# Patient Record
Sex: Female | Born: 1940
Health system: Southern US, Community
[De-identification: ages and names within clinical notes are randomized; demographics above are authoritative.]

## PROBLEM LIST (undated history)

## (undated) DIAGNOSIS — C801 Malignant (primary) neoplasm, unspecified: Secondary | ICD-10-CM

## (undated) DIAGNOSIS — I10 Essential (primary) hypertension: Secondary | ICD-10-CM

## (undated) DIAGNOSIS — J309 Allergic rhinitis, unspecified: Secondary | ICD-10-CM

## (undated) DIAGNOSIS — K635 Polyp of colon: Secondary | ICD-10-CM

## (undated) DIAGNOSIS — N189 Chronic kidney disease, unspecified: Secondary | ICD-10-CM

## (undated) HISTORY — PX: TONSILLECTOMY: SUR1361

---

## 1999-05-25 ENCOUNTER — Encounter: Admission: RE | Admit: 1999-05-25 | Discharge: 1999-05-25 | Payer: Self-pay | Admitting: Internal Medicine

## 1999-05-25 ENCOUNTER — Encounter: Payer: Self-pay | Admitting: Internal Medicine

## 1999-05-25 ENCOUNTER — Other Ambulatory Visit: Admission: RE | Admit: 1999-05-25 | Discharge: 1999-05-25 | Payer: Self-pay | Admitting: Internal Medicine

## 2000-05-24 ENCOUNTER — Encounter: Admission: RE | Admit: 2000-05-24 | Discharge: 2000-05-24 | Payer: Self-pay | Admitting: Internal Medicine

## 2000-05-24 ENCOUNTER — Encounter: Payer: Self-pay | Admitting: Internal Medicine

## 2001-07-10 ENCOUNTER — Other Ambulatory Visit: Admission: RE | Admit: 2001-07-10 | Discharge: 2001-07-10 | Payer: Self-pay | Admitting: Internal Medicine

## 2001-07-10 ENCOUNTER — Encounter: Admission: RE | Admit: 2001-07-10 | Discharge: 2001-07-10 | Payer: Self-pay | Admitting: Internal Medicine

## 2001-07-10 ENCOUNTER — Encounter: Payer: Self-pay | Admitting: Internal Medicine

## 2005-08-26 ENCOUNTER — Ambulatory Visit: Payer: Self-pay | Admitting: Internal Medicine

## 2005-09-14 ENCOUNTER — Ambulatory Visit: Payer: Self-pay | Admitting: Internal Medicine

## 2006-09-28 ENCOUNTER — Ambulatory Visit: Payer: Self-pay | Admitting: Internal Medicine

## 2007-09-29 ENCOUNTER — Ambulatory Visit: Payer: Self-pay | Admitting: Internal Medicine

## 2008-08-21 ENCOUNTER — Ambulatory Visit: Payer: Self-pay | Admitting: Unknown Physician Specialty

## 2008-08-21 ENCOUNTER — Encounter (INDEPENDENT_AMBULATORY_CARE_PROVIDER_SITE_OTHER): Payer: Self-pay | Admitting: *Deleted

## 2008-10-31 ENCOUNTER — Ambulatory Visit: Payer: Self-pay | Admitting: Internal Medicine

## 2008-11-14 ENCOUNTER — Ambulatory Visit: Payer: Self-pay | Admitting: Internal Medicine

## 2009-06-12 ENCOUNTER — Telehealth: Payer: Self-pay | Admitting: Gastroenterology

## 2009-11-13 ENCOUNTER — Ambulatory Visit: Payer: Self-pay | Admitting: Internal Medicine

## 2010-05-12 NOTE — Progress Notes (Signed)
Summary: Schedule Colonoscopy  Phone Note Outgoing Call Call back at [facility] Phone [phone]   Call placed by: Harlow Mares CMA Duncan Dull),  June 12, 2009 1:18 PM Call placed to: Patient Summary of Call: No Answer patient is due for a colonoscopy Initial call taken by: Harlow Mares CMA Duncan Dull),  June 12, 2009 1:18 PM  Follow-up for Phone Call        No Answer Follow-up by: Harlow Mares CMA Duncan Dull),  June 19, 2009 3:52 PM

## 2010-12-23 ENCOUNTER — Ambulatory Visit: Payer: Self-pay | Admitting: Internal Medicine

## 2011-10-26 ENCOUNTER — Encounter: Payer: Self-pay | Admitting: Gastroenterology

## 2011-12-24 ENCOUNTER — Ambulatory Visit: Payer: Self-pay | Admitting: Internal Medicine

## 2012-12-25 ENCOUNTER — Ambulatory Visit: Payer: Self-pay | Admitting: Internal Medicine

## 2013-03-15 ENCOUNTER — Ambulatory Visit (INDEPENDENT_AMBULATORY_CARE_PROVIDER_SITE_OTHER): Payer: Medicare Other | Admitting: Podiatrist

## 2013-03-15 ENCOUNTER — Encounter: Payer: Self-pay | Admitting: Podiatrist

## 2013-03-15 VITALS — BP 148/75 | HR 52 | Resp 16 | Ht 65.0 in | Wt 141.5 lb

## 2013-03-15 DIAGNOSIS — L600 Ingrowing nail: Secondary | ICD-10-CM

## 2013-03-15 NOTE — Patient Instructions (Signed)
ANTIBACTERIAL SOAP INSTRUCTIONS  THE DAY AFTER PROCEDURE-- you may use topical antibiotic ointment you may already have at home (polysporin, mupirocin, neosporin ointment)      Shower as usual. Before getting out, place a drop of antibacterial liquid soap (Dial) on a wet, clean washcloth.  Gently wipe washcloth over affected area.  Afterward, rinse the area with warm water.  Blot the area dry with a soft cloth and cover with antibiotic ointment (neosporin, polysporin, bacitracin) and band aid or gauze and tape  OR   Place 3-4 drops of antibacterial liquid soap in a quart of warm tap water.  Submerge foot into water for 20 minutes.  If bandage was applied after your procedure, leave on to allow for easy lift off, then remove and continue with soak for the remaining time.  Next, blot area dry with a soft cloth and cover with a bandage.  Apply other medications as directed by your doctor, such as cortisporin otic solution (eardrops) or neosporin antibiotic ointment    For the rough ridges on your toes, try a manicure/pedicure rotary burr kit.. This will quickly help get rid of dry skin and ridges on your feet.

## 2013-03-15 NOTE — Progress Notes (Signed)
MRN: [id number] Name: GLENDOLA FRIEDHOFF  Sex: female Age: 72 y.o. DOB: 1940-11-06  Provider: Marlowe Aschoff P  Allergies: Review of patient's allergies indicates no known allergies.   Chief Complaint  Patient presents with  . Follow-up    CHECK MY TOENAILS AND THE BOTTOM OF MY FEET     HPI: Patient is 72 y.o. female who presents today for a painful ingrowing toenail on the left hallux nail along the medial border.  Patient walks a lot and states it is uncomfortable when she walks.    No past medical history on file.     Medication List       This list is accurate as of: 03/15/13 11:59 PM.  Always use your most recent med list.               B-12 PO  Take by mouth daily.     CALCIUM + D PO  Take by mouth daily.     CINNAMON PO  Take by mouth daily.     CRANBERRY PO  Take by mouth daily.     VITAMIN C PO  Take by mouth daily.         No past surgical history on file.     Review of Systems  DATA OBTAINED: from patient intake form GENERAL: Feels well no fevers, no fatigue, no changes in appetite SKIN: No itching, no rashes, no open lesions, no wounds EYES: No eye pain,no redness, no discharge EARS: No earache,no ringing of ears, no recent change in hearing NOSE: No congestion, no drainage, no bleeding  MOUTH/THROAT: No mouth pain, No sore throat, No difficulty chewing or swallowing  RESPIRATORY: No cough, no wheezing, no SOB CARDIAC: No chest pain,no heart palpitations,no new onset lower extremity edema  GI: No abdominal pain, No Nausea, no vomiting, no diarrhea, no heartburn or no reflux  GU: No dysuria, no increased frequency or urgency MUSCULOSKELETAL: No unrelieved bone/joint pain,  NEUROLOGIC: Awake, alert, appropriate to situation, No change in mental status. PSYCHIATRIC: No overt anxiety or sadness.No behavior issue.  AMBULATION:  Ambulates unassisted  Filed Vitals:   03/15/13 0951  BP: 148/75  Pulse: 52  Resp: 16    Physical Exam  GENERAL  APPEARANCE: Alert, conversant. Appropriately groomed. No acute distress.  VASCULAR: Pedal pulses palpable and strong bilateral.  Capillary refill time is immediate to all digits,  Proximal to distal cooling it warm to warm.  Digital hair growth is present bilateral  NEUROLOGIC: sensation is intact epicritically and protectively to 5.07 monofilament at 5/5 sites bilateral.  Light touch is intact bilateral, vibratory sensation intact bilateral, achilles tendon reflex is intact bilateral.  MUSCULOSKELETAL: acceptable muscle strength, tone and stability bilateral.  Intrinsic muscluature intact bilateral.  Rectus appearance of foot and digits noted bilateral.   DERMATOLOGIC: skin color, texture, and turger are within normal limits.  No preulcerative lesions are seen, no interdigital maceration noted.  No open lesions present.  Left hallux nail medial nail border is ingrown and painful.  No signs of infection present.  Small area of hpk present along the hallux and first interspace bilateral.    Assessment  Ingrown toenail left hallux, medial border  Plan:  Treatment options and alternatives discussed.  Recommended permanent phenol matrixectomy and patient agreed.  Left hallux was prepped with alcohol and a 1 to 1 mix of 0.5% marcaine plain and 2% lidocaine plain was administered in a digital block fashion.  The toe was then prepped with betadine solution and  exsanguinated.  The offending nail border was then excised and matrix tissue exposed.  Phenol was then applied to the matrix tissue followed by an alcohol wash.  Antibiotic ointment and a dry sterile dressing was applied.  The patient was dispensed instructions for aftercare.      Delories Heinz, DPM

## 2013-03-22 ENCOUNTER — Encounter: Payer: Self-pay | Admitting: Podiatrist

## 2013-03-22 ENCOUNTER — Ambulatory Visit (INDEPENDENT_AMBULATORY_CARE_PROVIDER_SITE_OTHER): Payer: Medicare Other | Admitting: Podiatrist

## 2013-03-22 VITALS — BP 139/84 | HR 60 | Resp 16

## 2013-03-22 DIAGNOSIS — L600 Ingrowing nail: Secondary | ICD-10-CM

## 2013-03-22 NOTE — Progress Notes (Signed)
Melissa Harvey presents today for followup of ingrown toenail procedure medial aspect left great toe where permanent phenol matrixectomy was performed. She states "it still hurts" she states she's been trying to walk and do her circuit training however it became painful and uncomfortable after activity. She has been cleansing and daily and applying a bandage most of time.  Objective: Excellent appearance of the toe is seen. No swelling, no redness, no streaking, no drainage is noted.  Assessment: Status post permanent phenol matrixectomy medial aspect left great toe  Plan: Discussed the toe looks excellent. I'm not concerned about infection. Recommended the use of Epsom salts soaks and hydrogen peroxide to dissolve the line of scab in the corner. She's instructed to keep the toe covered during the day and leave open to air at night. She will be seen back as needed.  Marlowe Aschoff DPM

## 2013-03-22 NOTE — Progress Notes (Deleted)
   Subjective:    Patient ID: Melissa Harvey, female    DOB: [birth year], 72 y.o.   MRN: [id number]  HPI Comments: " It hurts still, left great toe.     Review of Systems     Objective:   Physical Exam  Nail uncomfortable but looks great.. Medial aspect left first      Assessment & Plan:

## 2013-10-05 ENCOUNTER — Ambulatory Visit: Payer: Self-pay | Admitting: Unknown Physician Specialty

## 2013-10-08 LAB — PATHOLOGY REPORT

## 2013-12-27 ENCOUNTER — Ambulatory Visit: Payer: Self-pay | Admitting: Internal Medicine

## 2014-02-10 ENCOUNTER — Emergency Department: Payer: Self-pay | Admitting: Emergency Medicine

## 2015-01-03 ENCOUNTER — Other Ambulatory Visit: Payer: Self-pay | Admitting: Internal Medicine

## 2015-01-03 DIAGNOSIS — Z1231 Encounter for screening mammogram for malignant neoplasm of breast: Secondary | ICD-10-CM

## 2015-01-10 ENCOUNTER — Ambulatory Visit
Admission: RE | Admit: 2015-01-10 | Discharge: 2015-01-10 | Disposition: A | Discharge status: Home / Self Care | Payer: PPO | Source: Ambulatory Visit | Attending: Internal Medicine | Admitting: Internal Medicine

## 2015-01-10 DIAGNOSIS — Z1231 Encounter for screening mammogram for malignant neoplasm of breast: Secondary | ICD-10-CM | POA: Diagnosis present

## 2015-05-05 DIAGNOSIS — M9905 Segmental and somatic dysfunction of pelvic region: Secondary | ICD-10-CM | POA: Diagnosis not present

## 2015-05-05 DIAGNOSIS — M5416 Radiculopathy, lumbar region: Secondary | ICD-10-CM | POA: Diagnosis not present

## 2015-05-05 DIAGNOSIS — M62830 Muscle spasm of back: Secondary | ICD-10-CM | POA: Diagnosis not present

## 2015-05-05 DIAGNOSIS — M9903 Segmental and somatic dysfunction of lumbar region: Secondary | ICD-10-CM | POA: Diagnosis not present

## 2015-05-15 DIAGNOSIS — L821 Other seborrheic keratosis: Secondary | ICD-10-CM | POA: Diagnosis not present

## 2015-05-15 DIAGNOSIS — X32XXXA Exposure to sunlight, initial encounter: Secondary | ICD-10-CM | POA: Diagnosis not present

## 2015-05-15 DIAGNOSIS — L570 Actinic keratosis: Secondary | ICD-10-CM | POA: Diagnosis not present

## 2015-05-15 DIAGNOSIS — L720 Epidermal cyst: Secondary | ICD-10-CM | POA: Diagnosis not present

## 2015-06-02 DIAGNOSIS — M5416 Radiculopathy, lumbar region: Secondary | ICD-10-CM | POA: Diagnosis not present

## 2015-06-02 DIAGNOSIS — M9905 Segmental and somatic dysfunction of pelvic region: Secondary | ICD-10-CM | POA: Diagnosis not present

## 2015-06-02 DIAGNOSIS — M9903 Segmental and somatic dysfunction of lumbar region: Secondary | ICD-10-CM | POA: Diagnosis not present

## 2015-06-02 DIAGNOSIS — M62830 Muscle spasm of back: Secondary | ICD-10-CM | POA: Diagnosis not present

## 2015-06-30 DIAGNOSIS — M9903 Segmental and somatic dysfunction of lumbar region: Secondary | ICD-10-CM | POA: Diagnosis not present

## 2015-06-30 DIAGNOSIS — M9905 Segmental and somatic dysfunction of pelvic region: Secondary | ICD-10-CM | POA: Diagnosis not present

## 2015-06-30 DIAGNOSIS — M62830 Muscle spasm of back: Secondary | ICD-10-CM | POA: Diagnosis not present

## 2015-06-30 DIAGNOSIS — M5416 Radiculopathy, lumbar region: Secondary | ICD-10-CM | POA: Diagnosis not present

## 2015-07-29 DIAGNOSIS — M9905 Segmental and somatic dysfunction of pelvic region: Secondary | ICD-10-CM | POA: Diagnosis not present

## 2015-07-29 DIAGNOSIS — M5416 Radiculopathy, lumbar region: Secondary | ICD-10-CM | POA: Diagnosis not present

## 2015-07-29 DIAGNOSIS — M9903 Segmental and somatic dysfunction of lumbar region: Secondary | ICD-10-CM | POA: Diagnosis not present

## 2015-07-29 DIAGNOSIS — M62830 Muscle spasm of back: Secondary | ICD-10-CM | POA: Diagnosis not present

## 2015-08-18 DIAGNOSIS — M62830 Muscle spasm of back: Secondary | ICD-10-CM | POA: Diagnosis not present

## 2015-08-18 DIAGNOSIS — M5416 Radiculopathy, lumbar region: Secondary | ICD-10-CM | POA: Diagnosis not present

## 2015-08-18 DIAGNOSIS — M9905 Segmental and somatic dysfunction of pelvic region: Secondary | ICD-10-CM | POA: Diagnosis not present

## 2015-08-18 DIAGNOSIS — M9903 Segmental and somatic dysfunction of lumbar region: Secondary | ICD-10-CM | POA: Diagnosis not present

## 2015-08-22 DIAGNOSIS — M62830 Muscle spasm of back: Secondary | ICD-10-CM | POA: Diagnosis not present

## 2015-08-22 DIAGNOSIS — M9903 Segmental and somatic dysfunction of lumbar region: Secondary | ICD-10-CM | POA: Diagnosis not present

## 2015-08-22 DIAGNOSIS — M5416 Radiculopathy, lumbar region: Secondary | ICD-10-CM | POA: Diagnosis not present

## 2015-08-22 DIAGNOSIS — M9905 Segmental and somatic dysfunction of pelvic region: Secondary | ICD-10-CM | POA: Diagnosis not present

## 2015-08-25 DIAGNOSIS — M9903 Segmental and somatic dysfunction of lumbar region: Secondary | ICD-10-CM | POA: Diagnosis not present

## 2015-08-25 DIAGNOSIS — M9905 Segmental and somatic dysfunction of pelvic region: Secondary | ICD-10-CM | POA: Diagnosis not present

## 2015-08-25 DIAGNOSIS — M5416 Radiculopathy, lumbar region: Secondary | ICD-10-CM | POA: Diagnosis not present

## 2015-08-25 DIAGNOSIS — M62830 Muscle spasm of back: Secondary | ICD-10-CM | POA: Diagnosis not present

## 2015-09-01 DIAGNOSIS — M9903 Segmental and somatic dysfunction of lumbar region: Secondary | ICD-10-CM | POA: Diagnosis not present

## 2015-09-01 DIAGNOSIS — M9905 Segmental and somatic dysfunction of pelvic region: Secondary | ICD-10-CM | POA: Diagnosis not present

## 2015-09-01 DIAGNOSIS — M62830 Muscle spasm of back: Secondary | ICD-10-CM | POA: Diagnosis not present

## 2015-09-01 DIAGNOSIS — M5416 Radiculopathy, lumbar region: Secondary | ICD-10-CM | POA: Diagnosis not present

## 2015-09-03 DIAGNOSIS — M5416 Radiculopathy, lumbar region: Secondary | ICD-10-CM | POA: Diagnosis not present

## 2015-09-03 DIAGNOSIS — M62830 Muscle spasm of back: Secondary | ICD-10-CM | POA: Diagnosis not present

## 2015-09-03 DIAGNOSIS — M9903 Segmental and somatic dysfunction of lumbar region: Secondary | ICD-10-CM | POA: Diagnosis not present

## 2015-09-03 DIAGNOSIS — M9905 Segmental and somatic dysfunction of pelvic region: Secondary | ICD-10-CM | POA: Diagnosis not present

## 2015-09-05 DIAGNOSIS — M9905 Segmental and somatic dysfunction of pelvic region: Secondary | ICD-10-CM | POA: Diagnosis not present

## 2015-09-05 DIAGNOSIS — M9903 Segmental and somatic dysfunction of lumbar region: Secondary | ICD-10-CM | POA: Diagnosis not present

## 2015-09-05 DIAGNOSIS — M62830 Muscle spasm of back: Secondary | ICD-10-CM | POA: Diagnosis not present

## 2015-09-05 DIAGNOSIS — M5416 Radiculopathy, lumbar region: Secondary | ICD-10-CM | POA: Diagnosis not present

## 2015-09-09 DIAGNOSIS — M62830 Muscle spasm of back: Secondary | ICD-10-CM | POA: Diagnosis not present

## 2015-09-09 DIAGNOSIS — M5416 Radiculopathy, lumbar region: Secondary | ICD-10-CM | POA: Diagnosis not present

## 2015-09-09 DIAGNOSIS — M9903 Segmental and somatic dysfunction of lumbar region: Secondary | ICD-10-CM | POA: Diagnosis not present

## 2015-09-09 DIAGNOSIS — M9905 Segmental and somatic dysfunction of pelvic region: Secondary | ICD-10-CM | POA: Diagnosis not present

## 2015-09-24 DIAGNOSIS — M5416 Radiculopathy, lumbar region: Secondary | ICD-10-CM | POA: Diagnosis not present

## 2015-09-24 DIAGNOSIS — M9905 Segmental and somatic dysfunction of pelvic region: Secondary | ICD-10-CM | POA: Diagnosis not present

## 2015-09-24 DIAGNOSIS — M62830 Muscle spasm of back: Secondary | ICD-10-CM | POA: Diagnosis not present

## 2015-09-24 DIAGNOSIS — M9903 Segmental and somatic dysfunction of lumbar region: Secondary | ICD-10-CM | POA: Diagnosis not present

## 2015-10-22 DIAGNOSIS — M9903 Segmental and somatic dysfunction of lumbar region: Secondary | ICD-10-CM | POA: Diagnosis not present

## 2015-10-22 DIAGNOSIS — M62830 Muscle spasm of back: Secondary | ICD-10-CM | POA: Diagnosis not present

## 2015-10-22 DIAGNOSIS — M9905 Segmental and somatic dysfunction of pelvic region: Secondary | ICD-10-CM | POA: Diagnosis not present

## 2015-10-22 DIAGNOSIS — M5416 Radiculopathy, lumbar region: Secondary | ICD-10-CM | POA: Diagnosis not present

## 2015-11-06 DIAGNOSIS — H2513 Age-related nuclear cataract, bilateral: Secondary | ICD-10-CM | POA: Diagnosis not present

## 2015-11-19 DIAGNOSIS — M62830 Muscle spasm of back: Secondary | ICD-10-CM | POA: Diagnosis not present

## 2015-11-19 DIAGNOSIS — M5416 Radiculopathy, lumbar region: Secondary | ICD-10-CM | POA: Diagnosis not present

## 2015-11-19 DIAGNOSIS — M9903 Segmental and somatic dysfunction of lumbar region: Secondary | ICD-10-CM | POA: Diagnosis not present

## 2015-11-19 DIAGNOSIS — M9905 Segmental and somatic dysfunction of pelvic region: Secondary | ICD-10-CM | POA: Diagnosis not present

## 2015-12-17 DIAGNOSIS — M62830 Muscle spasm of back: Secondary | ICD-10-CM | POA: Diagnosis not present

## 2015-12-17 DIAGNOSIS — M9905 Segmental and somatic dysfunction of pelvic region: Secondary | ICD-10-CM | POA: Diagnosis not present

## 2015-12-17 DIAGNOSIS — M9903 Segmental and somatic dysfunction of lumbar region: Secondary | ICD-10-CM | POA: Diagnosis not present

## 2015-12-17 DIAGNOSIS — M5416 Radiculopathy, lumbar region: Secondary | ICD-10-CM | POA: Diagnosis not present

## 2015-12-29 DIAGNOSIS — M8580 Other specified disorders of bone density and structure, unspecified site: Secondary | ICD-10-CM | POA: Diagnosis not present

## 2015-12-29 DIAGNOSIS — J3089 Other allergic rhinitis: Secondary | ICD-10-CM | POA: Diagnosis not present

## 2015-12-29 DIAGNOSIS — I10 Essential (primary) hypertension: Secondary | ICD-10-CM | POA: Diagnosis not present

## 2016-01-05 ENCOUNTER — Other Ambulatory Visit: Payer: Self-pay | Admitting: Internal Medicine

## 2016-01-05 DIAGNOSIS — E785 Hyperlipidemia, unspecified: Secondary | ICD-10-CM | POA: Diagnosis not present

## 2016-01-05 DIAGNOSIS — Z1239 Encounter for other screening for malignant neoplasm of breast: Secondary | ICD-10-CM | POA: Diagnosis not present

## 2016-01-05 DIAGNOSIS — Z0000 Encounter for general adult medical examination without abnormal findings: Secondary | ICD-10-CM | POA: Diagnosis not present

## 2016-01-05 DIAGNOSIS — M8589 Other specified disorders of bone density and structure, multiple sites: Secondary | ICD-10-CM | POA: Diagnosis not present

## 2016-01-05 DIAGNOSIS — R030 Elevated blood-pressure reading, without diagnosis of hypertension: Secondary | ICD-10-CM | POA: Diagnosis not present

## 2016-01-05 DIAGNOSIS — Z1231 Encounter for screening mammogram for malignant neoplasm of breast: Secondary | ICD-10-CM

## 2016-01-13 DIAGNOSIS — M9905 Segmental and somatic dysfunction of pelvic region: Secondary | ICD-10-CM | POA: Diagnosis not present

## 2016-01-13 DIAGNOSIS — M62830 Muscle spasm of back: Secondary | ICD-10-CM | POA: Diagnosis not present

## 2016-01-13 DIAGNOSIS — M5416 Radiculopathy, lumbar region: Secondary | ICD-10-CM | POA: Diagnosis not present

## 2016-01-13 DIAGNOSIS — M9903 Segmental and somatic dysfunction of lumbar region: Secondary | ICD-10-CM | POA: Diagnosis not present

## 2016-01-14 DIAGNOSIS — Z0000 Encounter for general adult medical examination without abnormal findings: Secondary | ICD-10-CM | POA: Diagnosis not present

## 2016-01-29 ENCOUNTER — Ambulatory Visit
Admission: RE | Admit: 2016-01-29 | Discharge: 2016-01-29 | Disposition: A | Discharge status: Home / Self Care | Payer: PPO | Source: Ambulatory Visit | Attending: Internal Medicine | Admitting: Internal Medicine

## 2016-01-29 DIAGNOSIS — Z1231 Encounter for screening mammogram for malignant neoplasm of breast: Secondary | ICD-10-CM

## 2016-02-06 ENCOUNTER — Other Ambulatory Visit: Payer: Self-pay | Admitting: Internal Medicine

## 2016-02-06 DIAGNOSIS — N6320 Unspecified lump in the left breast, unspecified quadrant: Secondary | ICD-10-CM

## 2016-02-10 ENCOUNTER — Ambulatory Visit
Admission: RE | Admit: 2016-02-10 | Discharge: 2016-02-10 | Disposition: A | Discharge status: Home / Self Care | Payer: PPO | Source: Ambulatory Visit | Attending: Internal Medicine | Admitting: Internal Medicine

## 2016-02-10 DIAGNOSIS — N6489 Other specified disorders of breast: Secondary | ICD-10-CM | POA: Diagnosis not present

## 2016-02-10 DIAGNOSIS — M62830 Muscle spasm of back: Secondary | ICD-10-CM | POA: Diagnosis not present

## 2016-02-10 DIAGNOSIS — M9905 Segmental and somatic dysfunction of pelvic region: Secondary | ICD-10-CM | POA: Diagnosis not present

## 2016-02-10 DIAGNOSIS — N6320 Unspecified lump in the left breast, unspecified quadrant: Secondary | ICD-10-CM | POA: Diagnosis not present

## 2016-02-10 DIAGNOSIS — M5416 Radiculopathy, lumbar region: Secondary | ICD-10-CM | POA: Diagnosis not present

## 2016-02-10 DIAGNOSIS — R928 Other abnormal and inconclusive findings on diagnostic imaging of breast: Secondary | ICD-10-CM | POA: Diagnosis not present

## 2016-02-10 DIAGNOSIS — M9903 Segmental and somatic dysfunction of lumbar region: Secondary | ICD-10-CM | POA: Diagnosis not present

## 2016-02-10 DIAGNOSIS — N63 Unspecified lump in unspecified breast: Secondary | ICD-10-CM | POA: Diagnosis not present

## 2016-02-13 DIAGNOSIS — M5416 Radiculopathy, lumbar region: Secondary | ICD-10-CM | POA: Diagnosis not present

## 2016-02-13 DIAGNOSIS — M9903 Segmental and somatic dysfunction of lumbar region: Secondary | ICD-10-CM | POA: Diagnosis not present

## 2016-02-13 DIAGNOSIS — M62830 Muscle spasm of back: Secondary | ICD-10-CM | POA: Diagnosis not present

## 2016-02-13 DIAGNOSIS — M9905 Segmental and somatic dysfunction of pelvic region: Secondary | ICD-10-CM | POA: Diagnosis not present

## 2016-03-01 DIAGNOSIS — M62830 Muscle spasm of back: Secondary | ICD-10-CM | POA: Diagnosis not present

## 2016-03-01 DIAGNOSIS — M9903 Segmental and somatic dysfunction of lumbar region: Secondary | ICD-10-CM | POA: Diagnosis not present

## 2016-03-01 DIAGNOSIS — M9905 Segmental and somatic dysfunction of pelvic region: Secondary | ICD-10-CM | POA: Diagnosis not present

## 2016-03-01 DIAGNOSIS — M5416 Radiculopathy, lumbar region: Secondary | ICD-10-CM | POA: Diagnosis not present

## 2016-03-22 DIAGNOSIS — M9905 Segmental and somatic dysfunction of pelvic region: Secondary | ICD-10-CM | POA: Diagnosis not present

## 2016-03-22 DIAGNOSIS — M5416 Radiculopathy, lumbar region: Secondary | ICD-10-CM | POA: Diagnosis not present

## 2016-03-22 DIAGNOSIS — M9903 Segmental and somatic dysfunction of lumbar region: Secondary | ICD-10-CM | POA: Diagnosis not present

## 2016-03-22 DIAGNOSIS — M62830 Muscle spasm of back: Secondary | ICD-10-CM | POA: Diagnosis not present

## 2016-04-19 DIAGNOSIS — M5416 Radiculopathy, lumbar region: Secondary | ICD-10-CM | POA: Diagnosis not present

## 2016-04-19 DIAGNOSIS — M62830 Muscle spasm of back: Secondary | ICD-10-CM | POA: Diagnosis not present

## 2016-04-19 DIAGNOSIS — M9905 Segmental and somatic dysfunction of pelvic region: Secondary | ICD-10-CM | POA: Diagnosis not present

## 2016-04-19 DIAGNOSIS — M9903 Segmental and somatic dysfunction of lumbar region: Secondary | ICD-10-CM | POA: Diagnosis not present

## 2016-05-05 DIAGNOSIS — K130 Diseases of lips: Secondary | ICD-10-CM | POA: Diagnosis not present

## 2016-05-05 DIAGNOSIS — D2262 Melanocytic nevi of left upper limb, including shoulder: Secondary | ICD-10-CM | POA: Diagnosis not present

## 2016-05-05 DIAGNOSIS — D225 Melanocytic nevi of trunk: Secondary | ICD-10-CM | POA: Diagnosis not present

## 2016-05-05 DIAGNOSIS — L821 Other seborrheic keratosis: Secondary | ICD-10-CM | POA: Diagnosis not present

## 2016-05-18 DIAGNOSIS — M9905 Segmental and somatic dysfunction of pelvic region: Secondary | ICD-10-CM | POA: Diagnosis not present

## 2016-05-18 DIAGNOSIS — M5416 Radiculopathy, lumbar region: Secondary | ICD-10-CM | POA: Diagnosis not present

## 2016-05-18 DIAGNOSIS — M9903 Segmental and somatic dysfunction of lumbar region: Secondary | ICD-10-CM | POA: Diagnosis not present

## 2016-05-18 DIAGNOSIS — M62830 Muscle spasm of back: Secondary | ICD-10-CM | POA: Diagnosis not present

## 2016-06-15 DIAGNOSIS — M9905 Segmental and somatic dysfunction of pelvic region: Secondary | ICD-10-CM | POA: Diagnosis not present

## 2016-06-15 DIAGNOSIS — M5416 Radiculopathy, lumbar region: Secondary | ICD-10-CM | POA: Diagnosis not present

## 2016-06-15 DIAGNOSIS — M9903 Segmental and somatic dysfunction of lumbar region: Secondary | ICD-10-CM | POA: Diagnosis not present

## 2016-06-15 DIAGNOSIS — M62830 Muscle spasm of back: Secondary | ICD-10-CM | POA: Diagnosis not present

## 2016-06-17 DIAGNOSIS — M549 Dorsalgia, unspecified: Secondary | ICD-10-CM | POA: Diagnosis not present

## 2016-07-12 DIAGNOSIS — M9905 Segmental and somatic dysfunction of pelvic region: Secondary | ICD-10-CM | POA: Diagnosis not present

## 2016-07-12 DIAGNOSIS — M62830 Muscle spasm of back: Secondary | ICD-10-CM | POA: Diagnosis not present

## 2016-07-12 DIAGNOSIS — M9903 Segmental and somatic dysfunction of lumbar region: Secondary | ICD-10-CM | POA: Diagnosis not present

## 2016-07-12 DIAGNOSIS — M5416 Radiculopathy, lumbar region: Secondary | ICD-10-CM | POA: Diagnosis not present

## 2016-07-27 DIAGNOSIS — R1013 Epigastric pain: Secondary | ICD-10-CM | POA: Diagnosis not present

## 2016-07-27 DIAGNOSIS — R030 Elevated blood-pressure reading, without diagnosis of hypertension: Secondary | ICD-10-CM | POA: Diagnosis not present

## 2016-08-10 DIAGNOSIS — M62830 Muscle spasm of back: Secondary | ICD-10-CM | POA: Diagnosis not present

## 2016-08-10 DIAGNOSIS — M5416 Radiculopathy, lumbar region: Secondary | ICD-10-CM | POA: Diagnosis not present

## 2016-08-10 DIAGNOSIS — M9903 Segmental and somatic dysfunction of lumbar region: Secondary | ICD-10-CM | POA: Diagnosis not present

## 2016-08-10 DIAGNOSIS — M9905 Segmental and somatic dysfunction of pelvic region: Secondary | ICD-10-CM | POA: Diagnosis not present

## 2016-08-19 DIAGNOSIS — N390 Urinary tract infection, site not specified: Secondary | ICD-10-CM | POA: Diagnosis not present

## 2016-08-19 DIAGNOSIS — I10 Essential (primary) hypertension: Secondary | ICD-10-CM | POA: Insufficient documentation

## 2016-08-19 DIAGNOSIS — E785 Hyperlipidemia, unspecified: Secondary | ICD-10-CM | POA: Diagnosis not present

## 2016-08-19 DIAGNOSIS — M8589 Other specified disorders of bone density and structure, multiple sites: Secondary | ICD-10-CM | POA: Diagnosis not present

## 2016-09-07 DIAGNOSIS — M62830 Muscle spasm of back: Secondary | ICD-10-CM | POA: Diagnosis not present

## 2016-09-07 DIAGNOSIS — M9903 Segmental and somatic dysfunction of lumbar region: Secondary | ICD-10-CM | POA: Diagnosis not present

## 2016-09-07 DIAGNOSIS — M5416 Radiculopathy, lumbar region: Secondary | ICD-10-CM | POA: Diagnosis not present

## 2016-09-07 DIAGNOSIS — M9905 Segmental and somatic dysfunction of pelvic region: Secondary | ICD-10-CM | POA: Diagnosis not present

## 2016-09-08 DIAGNOSIS — I10 Essential (primary) hypertension: Secondary | ICD-10-CM | POA: Diagnosis not present

## 2016-09-08 DIAGNOSIS — R21 Rash and other nonspecific skin eruption: Secondary | ICD-10-CM | POA: Diagnosis not present

## 2016-10-05 DIAGNOSIS — M9903 Segmental and somatic dysfunction of lumbar region: Secondary | ICD-10-CM | POA: Diagnosis not present

## 2016-10-05 DIAGNOSIS — M62830 Muscle spasm of back: Secondary | ICD-10-CM | POA: Diagnosis not present

## 2016-10-05 DIAGNOSIS — M9905 Segmental and somatic dysfunction of pelvic region: Secondary | ICD-10-CM | POA: Diagnosis not present

## 2016-10-05 DIAGNOSIS — M5416 Radiculopathy, lumbar region: Secondary | ICD-10-CM | POA: Diagnosis not present

## 2016-11-30 DIAGNOSIS — M9905 Segmental and somatic dysfunction of pelvic region: Secondary | ICD-10-CM | POA: Diagnosis not present

## 2016-11-30 DIAGNOSIS — M5416 Radiculopathy, lumbar region: Secondary | ICD-10-CM | POA: Diagnosis not present

## 2016-11-30 DIAGNOSIS — M9903 Segmental and somatic dysfunction of lumbar region: Secondary | ICD-10-CM | POA: Diagnosis not present

## 2016-11-30 DIAGNOSIS — M62830 Muscle spasm of back: Secondary | ICD-10-CM | POA: Diagnosis not present

## 2016-12-01 DIAGNOSIS — H25013 Cortical age-related cataract, bilateral: Secondary | ICD-10-CM | POA: Diagnosis not present

## 2016-12-30 DIAGNOSIS — Z0000 Encounter for general adult medical examination without abnormal findings: Secondary | ICD-10-CM | POA: Diagnosis not present

## 2016-12-30 DIAGNOSIS — M8589 Other specified disorders of bone density and structure, multiple sites: Secondary | ICD-10-CM | POA: Diagnosis not present

## 2016-12-30 DIAGNOSIS — E785 Hyperlipidemia, unspecified: Secondary | ICD-10-CM | POA: Diagnosis not present

## 2016-12-30 DIAGNOSIS — R030 Elevated blood-pressure reading, without diagnosis of hypertension: Secondary | ICD-10-CM | POA: Diagnosis not present

## 2016-12-30 DIAGNOSIS — I10 Essential (primary) hypertension: Secondary | ICD-10-CM | POA: Diagnosis not present

## 2017-01-05 ENCOUNTER — Other Ambulatory Visit: Payer: Self-pay | Admitting: Internal Medicine

## 2017-01-05 DIAGNOSIS — Z23 Encounter for immunization: Secondary | ICD-10-CM | POA: Diagnosis not present

## 2017-01-05 DIAGNOSIS — Z1231 Encounter for screening mammogram for malignant neoplasm of breast: Secondary | ICD-10-CM | POA: Diagnosis not present

## 2017-01-05 DIAGNOSIS — M8589 Other specified disorders of bone density and structure, multiple sites: Secondary | ICD-10-CM | POA: Diagnosis not present

## 2017-01-05 DIAGNOSIS — Z0000 Encounter for general adult medical examination without abnormal findings: Secondary | ICD-10-CM | POA: Diagnosis not present

## 2017-01-05 DIAGNOSIS — Z780 Asymptomatic menopausal state: Secondary | ICD-10-CM | POA: Diagnosis not present

## 2017-01-05 DIAGNOSIS — I10 Essential (primary) hypertension: Secondary | ICD-10-CM | POA: Diagnosis not present

## 2017-01-05 DIAGNOSIS — E785 Hyperlipidemia, unspecified: Secondary | ICD-10-CM | POA: Diagnosis not present

## 2017-01-05 DIAGNOSIS — N6320 Unspecified lump in the left breast, unspecified quadrant: Secondary | ICD-10-CM | POA: Diagnosis not present

## 2017-01-13 ENCOUNTER — Ambulatory Visit
Admission: RE | Admit: 2017-01-13 | Discharge: 2017-01-13 | Disposition: A | Discharge status: Home / Self Care | Payer: PPO | Source: Ambulatory Visit | Attending: Internal Medicine | Admitting: Internal Medicine

## 2017-01-13 DIAGNOSIS — N6320 Unspecified lump in the left breast, unspecified quadrant: Secondary | ICD-10-CM | POA: Diagnosis present

## 2017-01-13 DIAGNOSIS — R928 Other abnormal and inconclusive findings on diagnostic imaging of breast: Secondary | ICD-10-CM | POA: Insufficient documentation

## 2017-01-13 DIAGNOSIS — D171 Benign lipomatous neoplasm of skin and subcutaneous tissue of trunk: Secondary | ICD-10-CM | POA: Insufficient documentation

## 2017-01-13 DIAGNOSIS — N6321 Unspecified lump in the left breast, upper outer quadrant: Secondary | ICD-10-CM | POA: Insufficient documentation

## 2017-01-13 DIAGNOSIS — M8588 Other specified disorders of bone density and structure, other site: Secondary | ICD-10-CM | POA: Diagnosis not present

## 2017-01-18 ENCOUNTER — Other Ambulatory Visit: Payer: PPO

## 2017-01-18 DIAGNOSIS — M9905 Segmental and somatic dysfunction of pelvic region: Secondary | ICD-10-CM | POA: Diagnosis not present

## 2017-01-18 DIAGNOSIS — M9903 Segmental and somatic dysfunction of lumbar region: Secondary | ICD-10-CM | POA: Diagnosis not present

## 2017-01-18 DIAGNOSIS — M62830 Muscle spasm of back: Secondary | ICD-10-CM | POA: Diagnosis not present

## 2017-01-18 DIAGNOSIS — M5416 Radiculopathy, lumbar region: Secondary | ICD-10-CM | POA: Diagnosis not present

## 2017-02-15 DIAGNOSIS — M62830 Muscle spasm of back: Secondary | ICD-10-CM | POA: Diagnosis not present

## 2017-02-15 DIAGNOSIS — M5416 Radiculopathy, lumbar region: Secondary | ICD-10-CM | POA: Diagnosis not present

## 2017-02-15 DIAGNOSIS — M9905 Segmental and somatic dysfunction of pelvic region: Secondary | ICD-10-CM | POA: Diagnosis not present

## 2017-02-15 DIAGNOSIS — M9903 Segmental and somatic dysfunction of lumbar region: Secondary | ICD-10-CM | POA: Diagnosis not present

## 2017-03-16 DIAGNOSIS — M5416 Radiculopathy, lumbar region: Secondary | ICD-10-CM | POA: Diagnosis not present

## 2017-03-16 DIAGNOSIS — M62830 Muscle spasm of back: Secondary | ICD-10-CM | POA: Diagnosis not present

## 2017-03-16 DIAGNOSIS — M9903 Segmental and somatic dysfunction of lumbar region: Secondary | ICD-10-CM | POA: Diagnosis not present

## 2017-03-16 DIAGNOSIS — M9905 Segmental and somatic dysfunction of pelvic region: Secondary | ICD-10-CM | POA: Diagnosis not present

## 2017-05-04 DIAGNOSIS — L570 Actinic keratosis: Secondary | ICD-10-CM | POA: Diagnosis not present

## 2017-05-04 DIAGNOSIS — L821 Other seborrheic keratosis: Secondary | ICD-10-CM | POA: Diagnosis not present

## 2017-05-04 DIAGNOSIS — D225 Melanocytic nevi of trunk: Secondary | ICD-10-CM | POA: Diagnosis not present

## 2017-05-04 DIAGNOSIS — D2271 Melanocytic nevi of right lower limb, including hip: Secondary | ICD-10-CM | POA: Diagnosis not present

## 2017-05-04 DIAGNOSIS — X32XXXA Exposure to sunlight, initial encounter: Secondary | ICD-10-CM | POA: Diagnosis not present

## 2017-05-04 DIAGNOSIS — D2262 Melanocytic nevi of left upper limb, including shoulder: Secondary | ICD-10-CM | POA: Diagnosis not present

## 2017-05-09 DIAGNOSIS — M9903 Segmental and somatic dysfunction of lumbar region: Secondary | ICD-10-CM | POA: Diagnosis not present

## 2017-05-09 DIAGNOSIS — M5416 Radiculopathy, lumbar region: Secondary | ICD-10-CM | POA: Diagnosis not present

## 2017-05-09 DIAGNOSIS — M9905 Segmental and somatic dysfunction of pelvic region: Secondary | ICD-10-CM | POA: Diagnosis not present

## 2017-05-09 DIAGNOSIS — M62830 Muscle spasm of back: Secondary | ICD-10-CM | POA: Diagnosis not present

## 2017-05-25 DIAGNOSIS — M5416 Radiculopathy, lumbar region: Secondary | ICD-10-CM | POA: Diagnosis not present

## 2017-05-25 DIAGNOSIS — M9905 Segmental and somatic dysfunction of pelvic region: Secondary | ICD-10-CM | POA: Diagnosis not present

## 2017-05-25 DIAGNOSIS — M62830 Muscle spasm of back: Secondary | ICD-10-CM | POA: Diagnosis not present

## 2017-05-25 DIAGNOSIS — M9903 Segmental and somatic dysfunction of lumbar region: Secondary | ICD-10-CM | POA: Diagnosis not present

## 2017-05-27 DIAGNOSIS — M9903 Segmental and somatic dysfunction of lumbar region: Secondary | ICD-10-CM | POA: Diagnosis not present

## 2017-05-27 DIAGNOSIS — M9905 Segmental and somatic dysfunction of pelvic region: Secondary | ICD-10-CM | POA: Diagnosis not present

## 2017-05-27 DIAGNOSIS — M62830 Muscle spasm of back: Secondary | ICD-10-CM | POA: Diagnosis not present

## 2017-05-27 DIAGNOSIS — M5416 Radiculopathy, lumbar region: Secondary | ICD-10-CM | POA: Diagnosis not present

## 2017-05-30 DIAGNOSIS — M9903 Segmental and somatic dysfunction of lumbar region: Secondary | ICD-10-CM | POA: Diagnosis not present

## 2017-05-30 DIAGNOSIS — M5416 Radiculopathy, lumbar region: Secondary | ICD-10-CM | POA: Diagnosis not present

## 2017-05-30 DIAGNOSIS — M62830 Muscle spasm of back: Secondary | ICD-10-CM | POA: Diagnosis not present

## 2017-05-30 DIAGNOSIS — M9905 Segmental and somatic dysfunction of pelvic region: Secondary | ICD-10-CM | POA: Diagnosis not present

## 2017-06-06 DIAGNOSIS — M62830 Muscle spasm of back: Secondary | ICD-10-CM | POA: Diagnosis not present

## 2017-06-06 DIAGNOSIS — M9903 Segmental and somatic dysfunction of lumbar region: Secondary | ICD-10-CM | POA: Diagnosis not present

## 2017-06-06 DIAGNOSIS — M9905 Segmental and somatic dysfunction of pelvic region: Secondary | ICD-10-CM | POA: Diagnosis not present

## 2017-06-06 DIAGNOSIS — M5416 Radiculopathy, lumbar region: Secondary | ICD-10-CM | POA: Diagnosis not present

## 2017-06-20 DIAGNOSIS — M9903 Segmental and somatic dysfunction of lumbar region: Secondary | ICD-10-CM | POA: Diagnosis not present

## 2017-06-20 DIAGNOSIS — M62830 Muscle spasm of back: Secondary | ICD-10-CM | POA: Diagnosis not present

## 2017-06-20 DIAGNOSIS — M9905 Segmental and somatic dysfunction of pelvic region: Secondary | ICD-10-CM | POA: Diagnosis not present

## 2017-06-20 DIAGNOSIS — M5416 Radiculopathy, lumbar region: Secondary | ICD-10-CM | POA: Diagnosis not present

## 2017-06-30 DIAGNOSIS — I10 Essential (primary) hypertension: Secondary | ICD-10-CM | POA: Diagnosis not present

## 2017-06-30 DIAGNOSIS — E785 Hyperlipidemia, unspecified: Secondary | ICD-10-CM | POA: Diagnosis not present

## 2017-07-11 DIAGNOSIS — E785 Hyperlipidemia, unspecified: Secondary | ICD-10-CM | POA: Diagnosis not present

## 2017-07-11 DIAGNOSIS — D171 Benign lipomatous neoplasm of skin and subcutaneous tissue of trunk: Secondary | ICD-10-CM | POA: Insufficient documentation

## 2017-07-11 DIAGNOSIS — I10 Essential (primary) hypertension: Secondary | ICD-10-CM | POA: Diagnosis not present

## 2017-07-11 DIAGNOSIS — M8589 Other specified disorders of bone density and structure, multiple sites: Secondary | ICD-10-CM | POA: Diagnosis not present

## 2017-07-18 DIAGNOSIS — M9903 Segmental and somatic dysfunction of lumbar region: Secondary | ICD-10-CM | POA: Diagnosis not present

## 2017-07-18 DIAGNOSIS — M62830 Muscle spasm of back: Secondary | ICD-10-CM | POA: Diagnosis not present

## 2017-07-18 DIAGNOSIS — M9905 Segmental and somatic dysfunction of pelvic region: Secondary | ICD-10-CM | POA: Diagnosis not present

## 2017-07-18 DIAGNOSIS — M5416 Radiculopathy, lumbar region: Secondary | ICD-10-CM | POA: Diagnosis not present

## 2017-08-08 DIAGNOSIS — H6123 Impacted cerumen, bilateral: Secondary | ICD-10-CM | POA: Diagnosis not present

## 2017-08-08 DIAGNOSIS — K121 Other forms of stomatitis: Secondary | ICD-10-CM | POA: Diagnosis not present

## 2017-08-15 DIAGNOSIS — M5416 Radiculopathy, lumbar region: Secondary | ICD-10-CM | POA: Diagnosis not present

## 2017-08-15 DIAGNOSIS — M9905 Segmental and somatic dysfunction of pelvic region: Secondary | ICD-10-CM | POA: Diagnosis not present

## 2017-08-15 DIAGNOSIS — M9903 Segmental and somatic dysfunction of lumbar region: Secondary | ICD-10-CM | POA: Diagnosis not present

## 2017-08-15 DIAGNOSIS — M62830 Muscle spasm of back: Secondary | ICD-10-CM | POA: Diagnosis not present

## 2017-08-27 DIAGNOSIS — R05 Cough: Secondary | ICD-10-CM | POA: Diagnosis not present

## 2017-09-12 DIAGNOSIS — M5416 Radiculopathy, lumbar region: Secondary | ICD-10-CM | POA: Diagnosis not present

## 2017-09-12 DIAGNOSIS — M9903 Segmental and somatic dysfunction of lumbar region: Secondary | ICD-10-CM | POA: Diagnosis not present

## 2017-09-12 DIAGNOSIS — M62830 Muscle spasm of back: Secondary | ICD-10-CM | POA: Diagnosis not present

## 2017-09-12 DIAGNOSIS — M9905 Segmental and somatic dysfunction of pelvic region: Secondary | ICD-10-CM | POA: Diagnosis not present

## 2017-10-10 DIAGNOSIS — M5416 Radiculopathy, lumbar region: Secondary | ICD-10-CM | POA: Diagnosis not present

## 2017-10-10 DIAGNOSIS — M9905 Segmental and somatic dysfunction of pelvic region: Secondary | ICD-10-CM | POA: Diagnosis not present

## 2017-10-10 DIAGNOSIS — M9903 Segmental and somatic dysfunction of lumbar region: Secondary | ICD-10-CM | POA: Diagnosis not present

## 2017-10-10 DIAGNOSIS — M62830 Muscle spasm of back: Secondary | ICD-10-CM | POA: Diagnosis not present

## 2017-11-08 DIAGNOSIS — M5416 Radiculopathy, lumbar region: Secondary | ICD-10-CM | POA: Diagnosis not present

## 2017-11-08 DIAGNOSIS — M62830 Muscle spasm of back: Secondary | ICD-10-CM | POA: Diagnosis not present

## 2017-11-08 DIAGNOSIS — M9903 Segmental and somatic dysfunction of lumbar region: Secondary | ICD-10-CM | POA: Diagnosis not present

## 2017-11-08 DIAGNOSIS — M9905 Segmental and somatic dysfunction of pelvic region: Secondary | ICD-10-CM | POA: Diagnosis not present

## 2017-12-05 DIAGNOSIS — H25813 Combined forms of age-related cataract, bilateral: Secondary | ICD-10-CM | POA: Diagnosis not present

## 2017-12-07 DIAGNOSIS — M5416 Radiculopathy, lumbar region: Secondary | ICD-10-CM | POA: Diagnosis not present

## 2017-12-07 DIAGNOSIS — M9905 Segmental and somatic dysfunction of pelvic region: Secondary | ICD-10-CM | POA: Diagnosis not present

## 2017-12-07 DIAGNOSIS — M62830 Muscle spasm of back: Secondary | ICD-10-CM | POA: Diagnosis not present

## 2017-12-07 DIAGNOSIS — M9903 Segmental and somatic dysfunction of lumbar region: Secondary | ICD-10-CM | POA: Diagnosis not present

## 2018-01-04 DIAGNOSIS — M9903 Segmental and somatic dysfunction of lumbar region: Secondary | ICD-10-CM | POA: Diagnosis not present

## 2018-01-04 DIAGNOSIS — M9905 Segmental and somatic dysfunction of pelvic region: Secondary | ICD-10-CM | POA: Diagnosis not present

## 2018-01-04 DIAGNOSIS — M5416 Radiculopathy, lumbar region: Secondary | ICD-10-CM | POA: Diagnosis not present

## 2018-01-04 DIAGNOSIS — M62830 Muscle spasm of back: Secondary | ICD-10-CM | POA: Diagnosis not present

## 2018-01-05 DIAGNOSIS — E785 Hyperlipidemia, unspecified: Secondary | ICD-10-CM | POA: Diagnosis not present

## 2018-01-05 DIAGNOSIS — M8589 Other specified disorders of bone density and structure, multiple sites: Secondary | ICD-10-CM | POA: Diagnosis not present

## 2018-01-05 DIAGNOSIS — I10 Essential (primary) hypertension: Secondary | ICD-10-CM | POA: Diagnosis not present

## 2018-01-12 DIAGNOSIS — Z1239 Encounter for other screening for malignant neoplasm of breast: Secondary | ICD-10-CM | POA: Diagnosis not present

## 2018-01-12 DIAGNOSIS — N183 Chronic kidney disease, stage 3 unspecified: Secondary | ICD-10-CM | POA: Insufficient documentation

## 2018-01-12 DIAGNOSIS — Z0000 Encounter for general adult medical examination without abnormal findings: Secondary | ICD-10-CM | POA: Diagnosis not present

## 2018-01-12 DIAGNOSIS — I10 Essential (primary) hypertension: Secondary | ICD-10-CM | POA: Diagnosis not present

## 2018-01-12 DIAGNOSIS — M8589 Other specified disorders of bone density and structure, multiple sites: Secondary | ICD-10-CM | POA: Diagnosis not present

## 2018-01-12 DIAGNOSIS — E785 Hyperlipidemia, unspecified: Secondary | ICD-10-CM | POA: Diagnosis not present

## 2018-01-12 DIAGNOSIS — Z23 Encounter for immunization: Secondary | ICD-10-CM | POA: Diagnosis not present

## 2018-01-16 ENCOUNTER — Other Ambulatory Visit: Payer: Self-pay | Admitting: Internal Medicine

## 2018-01-16 DIAGNOSIS — Z1231 Encounter for screening mammogram for malignant neoplasm of breast: Secondary | ICD-10-CM

## 2018-02-01 DIAGNOSIS — M62830 Muscle spasm of back: Secondary | ICD-10-CM | POA: Diagnosis not present

## 2018-02-01 DIAGNOSIS — M5416 Radiculopathy, lumbar region: Secondary | ICD-10-CM | POA: Diagnosis not present

## 2018-02-01 DIAGNOSIS — M9905 Segmental and somatic dysfunction of pelvic region: Secondary | ICD-10-CM | POA: Diagnosis not present

## 2018-02-01 DIAGNOSIS — M9903 Segmental and somatic dysfunction of lumbar region: Secondary | ICD-10-CM | POA: Diagnosis not present

## 2018-02-02 ENCOUNTER — Ambulatory Visit
Admission: RE | Admit: 2018-02-02 | Discharge: 2018-02-02 | Disposition: A | Discharge status: Home / Self Care | Payer: PPO | Source: Ambulatory Visit | Attending: Internal Medicine | Admitting: Internal Medicine

## 2018-02-02 DIAGNOSIS — Z1231 Encounter for screening mammogram for malignant neoplasm of breast: Secondary | ICD-10-CM

## 2018-03-01 DIAGNOSIS — M5416 Radiculopathy, lumbar region: Secondary | ICD-10-CM | POA: Diagnosis not present

## 2018-03-01 DIAGNOSIS — M9905 Segmental and somatic dysfunction of pelvic region: Secondary | ICD-10-CM | POA: Diagnosis not present

## 2018-03-01 DIAGNOSIS — M62830 Muscle spasm of back: Secondary | ICD-10-CM | POA: Diagnosis not present

## 2018-03-01 DIAGNOSIS — M9903 Segmental and somatic dysfunction of lumbar region: Secondary | ICD-10-CM | POA: Diagnosis not present

## 2018-03-22 DIAGNOSIS — M62830 Muscle spasm of back: Secondary | ICD-10-CM | POA: Diagnosis not present

## 2018-03-22 DIAGNOSIS — M9905 Segmental and somatic dysfunction of pelvic region: Secondary | ICD-10-CM | POA: Diagnosis not present

## 2018-03-22 DIAGNOSIS — M9903 Segmental and somatic dysfunction of lumbar region: Secondary | ICD-10-CM | POA: Diagnosis not present

## 2018-03-22 DIAGNOSIS — M5416 Radiculopathy, lumbar region: Secondary | ICD-10-CM | POA: Diagnosis not present

## 2018-03-29 DIAGNOSIS — M9903 Segmental and somatic dysfunction of lumbar region: Secondary | ICD-10-CM | POA: Diagnosis not present

## 2018-03-29 DIAGNOSIS — M5416 Radiculopathy, lumbar region: Secondary | ICD-10-CM | POA: Diagnosis not present

## 2018-03-29 DIAGNOSIS — M62830 Muscle spasm of back: Secondary | ICD-10-CM | POA: Diagnosis not present

## 2018-03-29 DIAGNOSIS — M9905 Segmental and somatic dysfunction of pelvic region: Secondary | ICD-10-CM | POA: Diagnosis not present

## 2018-04-11 DIAGNOSIS — M62830 Muscle spasm of back: Secondary | ICD-10-CM | POA: Diagnosis not present

## 2018-04-11 DIAGNOSIS — M9905 Segmental and somatic dysfunction of pelvic region: Secondary | ICD-10-CM | POA: Diagnosis not present

## 2018-04-11 DIAGNOSIS — M9903 Segmental and somatic dysfunction of lumbar region: Secondary | ICD-10-CM | POA: Diagnosis not present

## 2018-04-11 DIAGNOSIS — M5416 Radiculopathy, lumbar region: Secondary | ICD-10-CM | POA: Diagnosis not present

## 2018-04-26 DIAGNOSIS — M5416 Radiculopathy, lumbar region: Secondary | ICD-10-CM | POA: Diagnosis not present

## 2018-04-26 DIAGNOSIS — M62830 Muscle spasm of back: Secondary | ICD-10-CM | POA: Diagnosis not present

## 2018-04-26 DIAGNOSIS — M9905 Segmental and somatic dysfunction of pelvic region: Secondary | ICD-10-CM | POA: Diagnosis not present

## 2018-04-26 DIAGNOSIS — M9903 Segmental and somatic dysfunction of lumbar region: Secondary | ICD-10-CM | POA: Diagnosis not present

## 2018-05-04 DIAGNOSIS — L821 Other seborrheic keratosis: Secondary | ICD-10-CM | POA: Diagnosis not present

## 2018-05-04 DIAGNOSIS — D225 Melanocytic nevi of trunk: Secondary | ICD-10-CM | POA: Diagnosis not present

## 2018-05-04 DIAGNOSIS — D2261 Melanocytic nevi of right upper limb, including shoulder: Secondary | ICD-10-CM | POA: Diagnosis not present

## 2018-05-04 DIAGNOSIS — D2272 Melanocytic nevi of left lower limb, including hip: Secondary | ICD-10-CM | POA: Diagnosis not present

## 2018-05-04 DIAGNOSIS — D2262 Melanocytic nevi of left upper limb, including shoulder: Secondary | ICD-10-CM | POA: Diagnosis not present

## 2018-05-04 DIAGNOSIS — D2271 Melanocytic nevi of right lower limb, including hip: Secondary | ICD-10-CM | POA: Diagnosis not present

## 2018-05-24 DIAGNOSIS — M9903 Segmental and somatic dysfunction of lumbar region: Secondary | ICD-10-CM | POA: Diagnosis not present

## 2018-05-24 DIAGNOSIS — M9905 Segmental and somatic dysfunction of pelvic region: Secondary | ICD-10-CM | POA: Diagnosis not present

## 2018-05-24 DIAGNOSIS — M62830 Muscle spasm of back: Secondary | ICD-10-CM | POA: Diagnosis not present

## 2018-05-24 DIAGNOSIS — M5416 Radiculopathy, lumbar region: Secondary | ICD-10-CM | POA: Diagnosis not present

## 2018-06-21 DIAGNOSIS — M9905 Segmental and somatic dysfunction of pelvic region: Secondary | ICD-10-CM | POA: Diagnosis not present

## 2018-06-21 DIAGNOSIS — M5416 Radiculopathy, lumbar region: Secondary | ICD-10-CM | POA: Diagnosis not present

## 2018-06-21 DIAGNOSIS — M9903 Segmental and somatic dysfunction of lumbar region: Secondary | ICD-10-CM | POA: Diagnosis not present

## 2018-06-21 DIAGNOSIS — M62830 Muscle spasm of back: Secondary | ICD-10-CM | POA: Diagnosis not present

## 2018-07-07 DIAGNOSIS — I10 Essential (primary) hypertension: Secondary | ICD-10-CM | POA: Diagnosis not present

## 2018-07-07 DIAGNOSIS — N183 Chronic kidney disease, stage 3 (moderate): Secondary | ICD-10-CM | POA: Diagnosis not present

## 2018-07-07 DIAGNOSIS — E785 Hyperlipidemia, unspecified: Secondary | ICD-10-CM | POA: Diagnosis not present

## 2018-07-14 DIAGNOSIS — M8589 Other specified disorders of bone density and structure, multiple sites: Secondary | ICD-10-CM | POA: Diagnosis not present

## 2018-07-14 DIAGNOSIS — I10 Essential (primary) hypertension: Secondary | ICD-10-CM | POA: Diagnosis not present

## 2018-07-14 DIAGNOSIS — N183 Chronic kidney disease, stage 3 (moderate): Secondary | ICD-10-CM | POA: Diagnosis not present

## 2018-07-14 DIAGNOSIS — E785 Hyperlipidemia, unspecified: Secondary | ICD-10-CM | POA: Diagnosis not present

## 2018-08-01 DIAGNOSIS — M9903 Segmental and somatic dysfunction of lumbar region: Secondary | ICD-10-CM | POA: Diagnosis not present

## 2018-08-01 DIAGNOSIS — M9905 Segmental and somatic dysfunction of pelvic region: Secondary | ICD-10-CM | POA: Diagnosis not present

## 2018-08-01 DIAGNOSIS — M62830 Muscle spasm of back: Secondary | ICD-10-CM | POA: Diagnosis not present

## 2018-08-01 DIAGNOSIS — M5416 Radiculopathy, lumbar region: Secondary | ICD-10-CM | POA: Diagnosis not present

## 2018-08-03 DIAGNOSIS — H1031 Unspecified acute conjunctivitis, right eye: Secondary | ICD-10-CM | POA: Diagnosis not present

## 2018-08-30 DIAGNOSIS — M9905 Segmental and somatic dysfunction of pelvic region: Secondary | ICD-10-CM | POA: Diagnosis not present

## 2018-08-30 DIAGNOSIS — M62830 Muscle spasm of back: Secondary | ICD-10-CM | POA: Diagnosis not present

## 2018-08-30 DIAGNOSIS — M5416 Radiculopathy, lumbar region: Secondary | ICD-10-CM | POA: Diagnosis not present

## 2018-08-30 DIAGNOSIS — M9903 Segmental and somatic dysfunction of lumbar region: Secondary | ICD-10-CM | POA: Diagnosis not present

## 2018-09-27 DIAGNOSIS — M9903 Segmental and somatic dysfunction of lumbar region: Secondary | ICD-10-CM | POA: Diagnosis not present

## 2018-09-27 DIAGNOSIS — M9905 Segmental and somatic dysfunction of pelvic region: Secondary | ICD-10-CM | POA: Diagnosis not present

## 2018-09-27 DIAGNOSIS — M62830 Muscle spasm of back: Secondary | ICD-10-CM | POA: Diagnosis not present

## 2018-09-27 DIAGNOSIS — M5416 Radiculopathy, lumbar region: Secondary | ICD-10-CM | POA: Diagnosis not present

## 2018-10-11 DIAGNOSIS — Z86010 Personal history of colonic polyps: Secondary | ICD-10-CM | POA: Diagnosis not present

## 2018-10-25 DIAGNOSIS — M5416 Radiculopathy, lumbar region: Secondary | ICD-10-CM | POA: Diagnosis not present

## 2018-10-25 DIAGNOSIS — M9905 Segmental and somatic dysfunction of pelvic region: Secondary | ICD-10-CM | POA: Diagnosis not present

## 2018-10-25 DIAGNOSIS — M9903 Segmental and somatic dysfunction of lumbar region: Secondary | ICD-10-CM | POA: Diagnosis not present

## 2018-10-25 DIAGNOSIS — M62830 Muscle spasm of back: Secondary | ICD-10-CM | POA: Diagnosis not present

## 2018-11-16 ENCOUNTER — Other Ambulatory Visit
Admission: RE | Admit: 2018-11-16 | Discharge: 2018-11-16 | Disposition: A | Discharge status: Home / Self Care | Payer: PPO | Source: Ambulatory Visit | Attending: Unknown Physician Specialty | Admitting: Unknown Physician Specialty

## 2018-11-22 DIAGNOSIS — M62830 Muscle spasm of back: Secondary | ICD-10-CM | POA: Diagnosis not present

## 2018-11-22 DIAGNOSIS — M5416 Radiculopathy, lumbar region: Secondary | ICD-10-CM | POA: Diagnosis not present

## 2018-11-22 DIAGNOSIS — M9903 Segmental and somatic dysfunction of lumbar region: Secondary | ICD-10-CM | POA: Diagnosis not present

## 2018-11-22 DIAGNOSIS — M9905 Segmental and somatic dysfunction of pelvic region: Secondary | ICD-10-CM | POA: Diagnosis not present

## 2018-11-24 ENCOUNTER — Ambulatory Visit: Admit: 2018-11-24 | Payer: PPO | Admitting: Unknown Physician Specialty

## 2018-11-24 SURGERY — COLONOSCOPY WITH PROPOFOL
Anesthesia: General

## 2018-12-04 ENCOUNTER — Other Ambulatory Visit: Payer: Self-pay

## 2018-12-04 ENCOUNTER — Other Ambulatory Visit
Admission: RE | Admit: 2018-12-04 | Discharge: 2018-12-04 | Disposition: A | Discharge status: Home / Self Care | Payer: PPO | Source: Ambulatory Visit | Attending: Gastroenterology | Admitting: Gastroenterology

## 2018-12-04 DIAGNOSIS — Z20828 Contact with and (suspected) exposure to other viral communicable diseases: Secondary | ICD-10-CM | POA: Diagnosis not present

## 2018-12-04 DIAGNOSIS — Z01812 Encounter for preprocedural laboratory examination: Secondary | ICD-10-CM | POA: Diagnosis not present

## 2018-12-04 LAB — SARS CORONAVIRUS 2 (TAT 6-24 HRS): SARS Coronavirus 2: NEGATIVE

## 2018-12-06 ENCOUNTER — Encounter: Payer: Self-pay | Admitting: *Deleted

## 2018-12-07 ENCOUNTER — Ambulatory Visit: Payer: PPO | Admitting: Anesthesiology

## 2018-12-07 ENCOUNTER — Ambulatory Visit
Admission: RE | Admit: 2018-12-07 | Discharge: 2018-12-07 | Disposition: A | Discharge status: Home / Self Care | Payer: PPO | Source: Ambulatory Visit | Attending: Gastroenterology | Admitting: Gastroenterology

## 2018-12-07 ENCOUNTER — Encounter: Payer: Self-pay | Admitting: Anesthesiology

## 2018-12-07 ENCOUNTER — Encounter
Admission: RE | Disposition: A | Discharge status: Home / Self Care | Payer: Self-pay | Source: Ambulatory Visit | Attending: Gastroenterology

## 2018-12-07 DIAGNOSIS — Z1211 Encounter for screening for malignant neoplasm of colon: Secondary | ICD-10-CM | POA: Diagnosis not present

## 2018-12-07 DIAGNOSIS — K5730 Diverticulosis of large intestine without perforation or abscess without bleeding: Secondary | ICD-10-CM | POA: Diagnosis not present

## 2018-12-07 DIAGNOSIS — N183 Chronic kidney disease, stage 3 (moderate): Secondary | ICD-10-CM | POA: Diagnosis not present

## 2018-12-07 DIAGNOSIS — Z86010 Personal history of colonic polyps: Secondary | ICD-10-CM | POA: Insufficient documentation

## 2018-12-07 DIAGNOSIS — I129 Hypertensive chronic kidney disease with stage 1 through stage 4 chronic kidney disease, or unspecified chronic kidney disease: Secondary | ICD-10-CM | POA: Insufficient documentation

## 2018-12-07 DIAGNOSIS — K5790 Diverticulosis of intestine, part unspecified, without perforation or abscess without bleeding: Secondary | ICD-10-CM | POA: Diagnosis not present

## 2018-12-07 HISTORY — DX: Allergic rhinitis, unspecified: J30.9

## 2018-12-07 HISTORY — PX: COLONOSCOPY WITH PROPOFOL: SHX5780

## 2018-12-07 HISTORY — DX: Polyp of colon: K63.5

## 2018-12-07 HISTORY — DX: Essential (primary) hypertension: I10

## 2018-12-07 HISTORY — DX: Chronic kidney disease, unspecified: N18.9

## 2018-12-07 SURGERY — COLONOSCOPY WITH PROPOFOL
Anesthesia: General

## 2018-12-07 MED ORDER — PROPOFOL 500 MG/50ML IV EMUL
INTRAVENOUS | Status: DC | PRN
  Administered 2018-12-07: 60 ug/kg/min via INTRAVENOUS

## 2018-12-07 MED ORDER — SODIUM CHLORIDE 0.9 % IV SOLN: INTRAVENOUS | Status: DC

## 2018-12-07 MED ORDER — GLYCOPYRROLATE 0.2 MG/ML IJ SOLN
INTRAMUSCULAR | Status: AC
  Filled 2018-12-07: qty 1

## 2018-12-07 MED ORDER — PROPOFOL 10 MG/ML IV BOLUS
INTRAVENOUS | Status: DC | PRN
  Administered 2018-12-07: 40 mg via INTRAVENOUS
  Administered 2018-12-07: 30 mg via INTRAVENOUS
  Administered 2018-12-07: 20 mg via INTRAVENOUS
  Administered 2018-12-07: 30 mg via INTRAVENOUS

## 2018-12-07 MED ORDER — PROPOFOL 500 MG/50ML IV EMUL
INTRAVENOUS | Status: AC
  Filled 2018-12-07: qty 50

## 2018-12-07 MED ORDER — LIDOCAINE HCL (PF) 2 % IJ SOLN
INTRAMUSCULAR | Status: AC
  Filled 2018-12-07: qty 10

## 2018-12-07 MED ORDER — GLYCOPYRROLATE 0.2 MG/ML IJ SOLN
INTRAMUSCULAR | Status: DC | PRN
  Administered 2018-12-07: 0.2 mg via INTRAVENOUS

## 2018-12-07 MED ORDER — SODIUM CHLORIDE 0.9 % IV SOLN
INTRAVENOUS | Status: DC
  Administered 2018-12-07: 12:00:00 via INTRAVENOUS

## 2018-12-07 MED ORDER — LIDOCAINE HCL (CARDIAC) PF 100 MG/5ML IV SOSY
PREFILLED_SYRINGE | INTRAVENOUS | Status: DC | PRN
  Administered 2018-12-07: 40 mg via INTRAVENOUS

## 2018-12-07 NOTE — Anesthesia Postprocedure Evaluation (Signed)
Anesthesia Post Note  Patient: Melissa Harvey  Procedure(s) Performed: COLONOSCOPY WITH PROPOFOL (N/A )  Patient location during evaluation: Endoscopy Anesthesia Type: General Level of consciousness: awake and alert Pain management: pain level controlled Vital Signs Assessment: post-procedure vital signs reviewed and stable Respiratory status: spontaneous breathing and respiratory function stable Cardiovascular status: stable Anesthetic complications: no     Last Vitals:  Vitals:   12/07/18 1337 12/07/18 1339  BP:  135/74  Pulse:  88  Resp: 14 (!) 9  Temp:  (!) 35.9 C  SpO2:  100%    Last Pain:  Vitals:   12/07/18 1339  TempSrc: Tympanic  PainSc: 0-No pain                 KEPHART,WILLIAM K

## 2018-12-07 NOTE — Op Note (Signed)
[facility] Gastroenterology Patient Name: Melissa Harvey Procedure Date: 12/07/2018 12:16 PM MRN: [id number] Account #: [id number] Date of Birth: 03/19/1941 Admit Type: Outpatient Age: 78 Room: [facility] ENDO ROOM 3 Gender: Female Note Status: Finalized Procedure:            Colonoscopy Indications:          Personal history of colonic polyps Providers:            Lollie Sails, MD Medicines:            Monitored Anesthesia Care Complications:        No immediate complications. Procedure:            Pre-Anesthesia Assessment:                       - ASA Grade Assessment: II - A patient with mild                        systemic disease.                       After obtaining informed consent, the colonoscope was                        passed under direct vision. Throughout the procedure,                        the patient's blood pressure, pulse, and oxygen                        saturations were monitored continuously. The was                        introduced through the anus and advanced to the the                        cecum, identified by appendiceal orifice and ileocecal                        valve. The colonoscopy was performed without                        difficulty. The patient tolerated the procedure well.                        The quality of the bowel preparation was good. Findings:      Multiple small and large-mouthed diverticula were found in the sigmoid       colon, descending colon, transverse colon and ascending colon, more       prominant in the left colon.      The exam was otherwise without abnormality.      The retroflexed view of the distal rectum and anal verge was normal and       showed no anal or rectal abnormalities.      The digital rectal exam was normal. Impression:           - Diverticulosis in the sigmoid colon, in the                        descending colon, in the transverse colon and in the  ascending  colon.                       - The examination was otherwise normal.                       - The distal rectum and anal verge are normal on                        retroflexion view.                       - No specimens collected. Recommendation:       - Repeat colonoscopy in 5 years for surveillance if                        desired as clinically feasible.                       - Advance diet as tolerated. Procedure Code(s):    --- Professional ---                       [phone], Colonoscopy, flexible; diagnostic, including                        collection of specimen(s) by brushing or washing, when                        performed (separate procedure) Diagnosis Code(s):    --- Professional ---                       Z86.010, Personal history of colonic polyps                       K57.30, Diverticulosis of large intestine without                        perforation or abscess without bleeding CPT copyright 2019 American [employer] Association. All rights reserved. The codes documented in this report are preliminary and upon coder review may  be revised to meet current compliance requirements. Lollie Sails, MD 12/07/2018 1:40:08 PM This report has been signed electronically. Number of Addenda: 0 Note Initiated On: 12/07/2018 12:16 PM Scope Withdrawal Time: 0 hours 12 minutes 58 seconds  Total Procedure Duration: 0 hours 23 minutes 39 seconds       [facility]

## 2018-12-07 NOTE — H&P (Signed)
Outpatient short stay form Pre-procedure 12/07/2018 1:04 PM Lollie Sails MD  Primary Physician: Dr. Ramonita Lab  Reason for visit: Colonoscopy  History of present illness: Patient is a 78 year old female presenting today for colonoscopy in regards to her personal history of adenomatous colon polyps.  Her last colonoscopy was 10/05/2013 with a finding of 2 tubular adenomas removed at that time.  She tolerated her prep well.  She takes no aspirin or blood thinning agent.    Current Facility-Administered Medications:  .  0.9 %  sodium chloride infusion, , Intravenous, Continuous, Lollie Sails, MD, Last Rate: 20 mL/hr at 12/07/18 1217 .  0.9 %  sodium chloride infusion, , Intravenous, Continuous, Lollie Sails, MD  Medications Prior to Admission  Medication Sig Dispense Refill Last Dose  . acetaminophen (TYLENOL) 500 MG tablet Take 500 mg by mouth every 6 (six) hours as needed.   12/06/2018 at 2000  . Ascorbic Acid (VITAMIN C PO) Take by mouth daily.   Past Week at Unknown time  . Calcium Carbonate-Vitamin D (CALCIUM + D PO) Take by mouth daily.   Past Week at Unknown time  . CINNAMON PO Take by mouth daily.   Past Week at Unknown time  . CRANBERRY PO Take by mouth daily.   Past Week at Unknown time  . Cyanocobalamin (B-12 PO) Take by mouth daily.   Past Week at Unknown time  . amLODipine (NORVASC) 2.5 MG tablet Take 2.5 mg by mouth daily.   Not Taking at Unknown time     No Known Allergies   Past Medical History:  Diagnosis Date  . Allergic rhinitis   . Chronic kidney disease    CKD - Stage 3  . Colon polyps   . Hypertension     Review of systems:      Physical Exam    Heart and lungs: Regular rate and rhythm without rub or gallop lungs are bilaterally clear    HEENT: Normocephalic atraumatic eyes are anicteric    Other:    Pertinant exam for procedure: Soft nontender nondistended bowel sounds positive normoactive    Planned proceedures: Colonoscopy and  indicated procedures. I have discussed the risks benefits and complications of procedures to include not limited to bleeding, infection, perforation and the risk of sedation and the patient wishes to proceed.    Lollie Sails, MD Gastroenterology 12/07/2018  1:04 PM

## 2018-12-07 NOTE — Anesthesia Preprocedure Evaluation (Signed)
Anesthesia Evaluation  Patient identified by MRN, date of birth, ID band Patient awake    Reviewed: Allergy & Precautions, NPO status , Patient's Chart, lab work & pertinent test results, reviewed documented beta blocker date and time   Airway Mallampati: II  TM Distance: >3 FB     Dental  (+) Chipped   Pulmonary           Cardiovascular hypertension, Pt. on medications      Neuro/Psych    GI/Hepatic   Endo/Other    Renal/GU Renal disease     Musculoskeletal   Abdominal   Peds  Hematology   Anesthesia Other Findings   Reproductive/Obstetrics                             Anesthesia Physical Anesthesia Plan  ASA: II  Anesthesia Plan: General   Post-op Pain Management:    Induction: Intravenous  PONV Risk Score and Plan:   Airway Management Planned:   Additional Equipment:   Intra-op Plan:   Post-operative Plan:   Informed Consent: I have reviewed the patients History and Physical, chart, labs and discussed the procedure including the risks, benefits and alternatives for the proposed anesthesia with the patient or authorized representative who has indicated his/her understanding and acceptance.       Plan Discussed with: CRNA  Anesthesia Plan Comments:         Anesthesia Quick Evaluation

## 2018-12-07 NOTE — Anesthesia Post-op Follow-up Note (Signed)
Anesthesia QCDR form completed.        

## 2018-12-07 NOTE — Transfer of Care (Signed)
Immediate Anesthesia Transfer of Care Note  Patient: Melissa Harvey  Procedure(s) Performed: COLONOSCOPY WITH PROPOFOL (N/A )  Patient Location: PACU  Anesthesia Type:General  Level of Consciousness: awake, alert  and oriented  Airway & Oxygen Therapy: Patient Spontanous Breathing  Post-op Assessment: Report given to RN and Post -op Vital signs reviewed and stable  Post vital signs: Reviewed and stable  Last Vitals:  Vitals Value Taken Time  BP 135/74 12/07/18 1339  Temp 35.9 C 12/07/18 1339  Pulse 73 12/07/18 1342  Resp 19 12/07/18 1342  SpO2 100 % 12/07/18 1342  Vitals shown include unvalidated device data.  Last Pain:  Vitals:   12/07/18 1339  TempSrc: Tympanic  PainSc: 0-No pain         Complications: No apparent anesthesia complications

## 2018-12-08 ENCOUNTER — Encounter: Payer: Self-pay | Admitting: Gastroenterology

## 2018-12-20 DIAGNOSIS — M5416 Radiculopathy, lumbar region: Secondary | ICD-10-CM | POA: Diagnosis not present

## 2018-12-20 DIAGNOSIS — M9903 Segmental and somatic dysfunction of lumbar region: Secondary | ICD-10-CM | POA: Diagnosis not present

## 2018-12-20 DIAGNOSIS — M9905 Segmental and somatic dysfunction of pelvic region: Secondary | ICD-10-CM | POA: Diagnosis not present

## 2018-12-20 DIAGNOSIS — M62830 Muscle spasm of back: Secondary | ICD-10-CM | POA: Diagnosis not present

## 2019-01-10 DIAGNOSIS — I10 Essential (primary) hypertension: Secondary | ICD-10-CM | POA: Diagnosis not present

## 2019-01-10 DIAGNOSIS — N183 Chronic kidney disease, stage 3 (moderate): Secondary | ICD-10-CM | POA: Diagnosis not present

## 2019-01-10 DIAGNOSIS — E785 Hyperlipidemia, unspecified: Secondary | ICD-10-CM | POA: Diagnosis not present

## 2019-01-16 DIAGNOSIS — M9903 Segmental and somatic dysfunction of lumbar region: Secondary | ICD-10-CM | POA: Diagnosis not present

## 2019-01-16 DIAGNOSIS — M62830 Muscle spasm of back: Secondary | ICD-10-CM | POA: Diagnosis not present

## 2019-01-16 DIAGNOSIS — M9905 Segmental and somatic dysfunction of pelvic region: Secondary | ICD-10-CM | POA: Diagnosis not present

## 2019-01-16 DIAGNOSIS — M5416 Radiculopathy, lumbar region: Secondary | ICD-10-CM | POA: Diagnosis not present

## 2019-01-17 ENCOUNTER — Other Ambulatory Visit: Payer: Self-pay | Admitting: Internal Medicine

## 2019-01-17 DIAGNOSIS — M8588 Other specified disorders of bone density and structure, other site: Secondary | ICD-10-CM | POA: Diagnosis not present

## 2019-01-17 DIAGNOSIS — Z1231 Encounter for screening mammogram for malignant neoplasm of breast: Secondary | ICD-10-CM

## 2019-01-17 DIAGNOSIS — Z1159 Encounter for screening for other viral diseases: Secondary | ICD-10-CM | POA: Diagnosis not present

## 2019-01-17 DIAGNOSIS — N1831 Chronic kidney disease, stage 3a: Secondary | ICD-10-CM | POA: Diagnosis not present

## 2019-01-17 DIAGNOSIS — Z0000 Encounter for general adult medical examination without abnormal findings: Secondary | ICD-10-CM | POA: Diagnosis not present

## 2019-01-17 DIAGNOSIS — E785 Hyperlipidemia, unspecified: Secondary | ICD-10-CM | POA: Diagnosis not present

## 2019-01-17 DIAGNOSIS — I10 Essential (primary) hypertension: Secondary | ICD-10-CM | POA: Diagnosis not present

## 2019-01-17 DIAGNOSIS — Z23 Encounter for immunization: Secondary | ICD-10-CM | POA: Diagnosis not present

## 2019-01-17 DIAGNOSIS — Z780 Asymptomatic menopausal state: Secondary | ICD-10-CM | POA: Diagnosis not present

## 2019-02-06 ENCOUNTER — Ambulatory Visit
Admission: RE | Admit: 2019-02-06 | Discharge: 2019-02-06 | Disposition: A | Discharge status: Home / Self Care | Payer: PPO | Source: Ambulatory Visit | Attending: Internal Medicine | Admitting: Internal Medicine

## 2019-02-06 ENCOUNTER — Other Ambulatory Visit: Payer: Self-pay

## 2019-02-06 DIAGNOSIS — Z1231 Encounter for screening mammogram for malignant neoplasm of breast: Secondary | ICD-10-CM | POA: Insufficient documentation

## 2019-02-13 DIAGNOSIS — M9905 Segmental and somatic dysfunction of pelvic region: Secondary | ICD-10-CM | POA: Diagnosis not present

## 2019-02-13 DIAGNOSIS — M9903 Segmental and somatic dysfunction of lumbar region: Secondary | ICD-10-CM | POA: Diagnosis not present

## 2019-02-13 DIAGNOSIS — M5416 Radiculopathy, lumbar region: Secondary | ICD-10-CM | POA: Diagnosis not present

## 2019-02-13 DIAGNOSIS — M62830 Muscle spasm of back: Secondary | ICD-10-CM | POA: Diagnosis not present

## 2019-02-26 DIAGNOSIS — M9903 Segmental and somatic dysfunction of lumbar region: Secondary | ICD-10-CM | POA: Diagnosis not present

## 2019-02-26 DIAGNOSIS — M62830 Muscle spasm of back: Secondary | ICD-10-CM | POA: Diagnosis not present

## 2019-02-26 DIAGNOSIS — M9905 Segmental and somatic dysfunction of pelvic region: Secondary | ICD-10-CM | POA: Diagnosis not present

## 2019-02-26 DIAGNOSIS — M5416 Radiculopathy, lumbar region: Secondary | ICD-10-CM | POA: Diagnosis not present

## 2019-03-13 DIAGNOSIS — M9905 Segmental and somatic dysfunction of pelvic region: Secondary | ICD-10-CM | POA: Diagnosis not present

## 2019-03-13 DIAGNOSIS — M5416 Radiculopathy, lumbar region: Secondary | ICD-10-CM | POA: Diagnosis not present

## 2019-03-13 DIAGNOSIS — M9903 Segmental and somatic dysfunction of lumbar region: Secondary | ICD-10-CM | POA: Diagnosis not present

## 2019-03-13 DIAGNOSIS — M62830 Muscle spasm of back: Secondary | ICD-10-CM | POA: Diagnosis not present

## 2019-04-09 DIAGNOSIS — M5416 Radiculopathy, lumbar region: Secondary | ICD-10-CM | POA: Diagnosis not present

## 2019-04-09 DIAGNOSIS — M62830 Muscle spasm of back: Secondary | ICD-10-CM | POA: Diagnosis not present

## 2019-04-09 DIAGNOSIS — M9905 Segmental and somatic dysfunction of pelvic region: Secondary | ICD-10-CM | POA: Diagnosis not present

## 2019-04-09 DIAGNOSIS — M9903 Segmental and somatic dysfunction of lumbar region: Secondary | ICD-10-CM | POA: Diagnosis not present

## 2019-04-10 DIAGNOSIS — H2513 Age-related nuclear cataract, bilateral: Secondary | ICD-10-CM | POA: Diagnosis not present

## 2019-05-02 DIAGNOSIS — H93291 Other abnormal auditory perceptions, right ear: Secondary | ICD-10-CM | POA: Diagnosis not present

## 2019-05-02 DIAGNOSIS — H6121 Impacted cerumen, right ear: Secondary | ICD-10-CM | POA: Diagnosis not present

## 2019-05-03 DIAGNOSIS — D2261 Melanocytic nevi of right upper limb, including shoulder: Secondary | ICD-10-CM | POA: Diagnosis not present

## 2019-05-03 DIAGNOSIS — D2272 Melanocytic nevi of left lower limb, including hip: Secondary | ICD-10-CM | POA: Diagnosis not present

## 2019-05-03 DIAGNOSIS — D225 Melanocytic nevi of trunk: Secondary | ICD-10-CM | POA: Diagnosis not present

## 2019-05-03 DIAGNOSIS — D2262 Melanocytic nevi of left upper limb, including shoulder: Secondary | ICD-10-CM | POA: Diagnosis not present

## 2019-05-03 DIAGNOSIS — Z1283 Encounter for screening for malignant neoplasm of skin: Secondary | ICD-10-CM | POA: Diagnosis not present

## 2019-05-03 DIAGNOSIS — L821 Other seborrheic keratosis: Secondary | ICD-10-CM | POA: Diagnosis not present

## 2019-05-03 DIAGNOSIS — X32XXXA Exposure to sunlight, initial encounter: Secondary | ICD-10-CM | POA: Diagnosis not present

## 2019-05-03 DIAGNOSIS — L570 Actinic keratosis: Secondary | ICD-10-CM | POA: Diagnosis not present

## 2019-05-03 DIAGNOSIS — D2271 Melanocytic nevi of right lower limb, including hip: Secondary | ICD-10-CM | POA: Diagnosis not present

## 2019-05-08 DIAGNOSIS — M9903 Segmental and somatic dysfunction of lumbar region: Secondary | ICD-10-CM | POA: Diagnosis not present

## 2019-05-08 DIAGNOSIS — M62830 Muscle spasm of back: Secondary | ICD-10-CM | POA: Diagnosis not present

## 2019-05-08 DIAGNOSIS — M9905 Segmental and somatic dysfunction of pelvic region: Secondary | ICD-10-CM | POA: Diagnosis not present

## 2019-05-08 DIAGNOSIS — M5416 Radiculopathy, lumbar region: Secondary | ICD-10-CM | POA: Diagnosis not present

## 2019-06-05 DIAGNOSIS — M5416 Radiculopathy, lumbar region: Secondary | ICD-10-CM | POA: Diagnosis not present

## 2019-06-05 DIAGNOSIS — M9905 Segmental and somatic dysfunction of pelvic region: Secondary | ICD-10-CM | POA: Diagnosis not present

## 2019-06-05 DIAGNOSIS — M62830 Muscle spasm of back: Secondary | ICD-10-CM | POA: Diagnosis not present

## 2019-06-05 DIAGNOSIS — M9903 Segmental and somatic dysfunction of lumbar region: Secondary | ICD-10-CM | POA: Diagnosis not present

## 2019-07-03 DIAGNOSIS — M9905 Segmental and somatic dysfunction of pelvic region: Secondary | ICD-10-CM | POA: Diagnosis not present

## 2019-07-03 DIAGNOSIS — M5416 Radiculopathy, lumbar region: Secondary | ICD-10-CM | POA: Diagnosis not present

## 2019-07-03 DIAGNOSIS — M9903 Segmental and somatic dysfunction of lumbar region: Secondary | ICD-10-CM | POA: Diagnosis not present

## 2019-07-03 DIAGNOSIS — M62830 Muscle spasm of back: Secondary | ICD-10-CM | POA: Diagnosis not present

## 2019-07-11 DIAGNOSIS — N1831 Chronic kidney disease, stage 3a: Secondary | ICD-10-CM | POA: Diagnosis not present

## 2019-07-11 DIAGNOSIS — E785 Hyperlipidemia, unspecified: Secondary | ICD-10-CM | POA: Diagnosis not present

## 2019-07-11 DIAGNOSIS — Z1159 Encounter for screening for other viral diseases: Secondary | ICD-10-CM | POA: Diagnosis not present

## 2019-07-11 DIAGNOSIS — I10 Essential (primary) hypertension: Secondary | ICD-10-CM | POA: Diagnosis not present

## 2019-07-19 DIAGNOSIS — M8589 Other specified disorders of bone density and structure, multiple sites: Secondary | ICD-10-CM | POA: Diagnosis not present

## 2019-07-19 DIAGNOSIS — E785 Hyperlipidemia, unspecified: Secondary | ICD-10-CM | POA: Diagnosis not present

## 2019-07-19 DIAGNOSIS — I517 Cardiomegaly: Secondary | ICD-10-CM | POA: Diagnosis not present

## 2019-07-19 DIAGNOSIS — I878 Other specified disorders of veins: Secondary | ICD-10-CM | POA: Diagnosis not present

## 2019-07-19 DIAGNOSIS — N1831 Chronic kidney disease, stage 3a: Secondary | ICD-10-CM | POA: Diagnosis not present

## 2019-07-19 DIAGNOSIS — I10 Essential (primary) hypertension: Secondary | ICD-10-CM | POA: Diagnosis not present

## 2019-07-19 DIAGNOSIS — N390 Urinary tract infection, site not specified: Secondary | ICD-10-CM | POA: Diagnosis not present

## 2019-07-23 ENCOUNTER — Other Ambulatory Visit: Payer: Self-pay | Admitting: Internal Medicine

## 2019-07-23 DIAGNOSIS — R9389 Abnormal findings on diagnostic imaging of other specified body structures: Secondary | ICD-10-CM

## 2019-07-23 DIAGNOSIS — I878 Other specified disorders of veins: Secondary | ICD-10-CM

## 2019-07-31 ENCOUNTER — Ambulatory Visit
Admission: RE | Admit: 2019-07-31 | Discharge: 2019-07-31 | Disposition: A | Discharge status: Home / Self Care | Payer: PPO | Source: Ambulatory Visit | Attending: Internal Medicine | Admitting: Internal Medicine

## 2019-07-31 ENCOUNTER — Other Ambulatory Visit: Payer: Self-pay

## 2019-07-31 DIAGNOSIS — R9389 Abnormal findings on diagnostic imaging of other specified body structures: Secondary | ICD-10-CM | POA: Insufficient documentation

## 2019-07-31 DIAGNOSIS — M62830 Muscle spasm of back: Secondary | ICD-10-CM | POA: Diagnosis not present

## 2019-07-31 DIAGNOSIS — I878 Other specified disorders of veins: Secondary | ICD-10-CM | POA: Diagnosis not present

## 2019-07-31 DIAGNOSIS — M5416 Radiculopathy, lumbar region: Secondary | ICD-10-CM | POA: Diagnosis not present

## 2019-07-31 DIAGNOSIS — R911 Solitary pulmonary nodule: Secondary | ICD-10-CM | POA: Diagnosis not present

## 2019-07-31 DIAGNOSIS — M9905 Segmental and somatic dysfunction of pelvic region: Secondary | ICD-10-CM | POA: Diagnosis not present

## 2019-07-31 DIAGNOSIS — M9903 Segmental and somatic dysfunction of lumbar region: Secondary | ICD-10-CM | POA: Diagnosis not present

## 2019-08-02 DIAGNOSIS — R911 Solitary pulmonary nodule: Secondary | ICD-10-CM | POA: Insufficient documentation

## 2019-08-21 DIAGNOSIS — R911 Solitary pulmonary nodule: Secondary | ICD-10-CM | POA: Diagnosis not present

## 2019-08-22 DIAGNOSIS — M62830 Muscle spasm of back: Secondary | ICD-10-CM | POA: Diagnosis not present

## 2019-08-22 DIAGNOSIS — M9905 Segmental and somatic dysfunction of pelvic region: Secondary | ICD-10-CM | POA: Diagnosis not present

## 2019-08-22 DIAGNOSIS — M9903 Segmental and somatic dysfunction of lumbar region: Secondary | ICD-10-CM | POA: Diagnosis not present

## 2019-08-22 DIAGNOSIS — M5416 Radiculopathy, lumbar region: Secondary | ICD-10-CM | POA: Diagnosis not present

## 2019-08-27 DIAGNOSIS — M62830 Muscle spasm of back: Secondary | ICD-10-CM | POA: Diagnosis not present

## 2019-08-27 DIAGNOSIS — M5416 Radiculopathy, lumbar region: Secondary | ICD-10-CM | POA: Diagnosis not present

## 2019-08-27 DIAGNOSIS — M9903 Segmental and somatic dysfunction of lumbar region: Secondary | ICD-10-CM | POA: Diagnosis not present

## 2019-08-27 DIAGNOSIS — M9905 Segmental and somatic dysfunction of pelvic region: Secondary | ICD-10-CM | POA: Diagnosis not present

## 2019-08-28 DIAGNOSIS — R918 Other nonspecific abnormal finding of lung field: Secondary | ICD-10-CM | POA: Diagnosis not present

## 2019-08-28 DIAGNOSIS — R911 Solitary pulmonary nodule: Secondary | ICD-10-CM | POA: Diagnosis not present

## 2019-08-29 DIAGNOSIS — R911 Solitary pulmonary nodule: Secondary | ICD-10-CM | POA: Diagnosis not present

## 2019-08-29 DIAGNOSIS — Z01818 Encounter for other preprocedural examination: Secondary | ICD-10-CM | POA: Diagnosis not present

## 2019-09-04 DIAGNOSIS — R911 Solitary pulmonary nodule: Secondary | ICD-10-CM | POA: Diagnosis not present

## 2019-09-07 ENCOUNTER — Other Ambulatory Visit: Payer: Self-pay | Admitting: Pulmonary Disease

## 2019-09-07 DIAGNOSIS — R911 Solitary pulmonary nodule: Secondary | ICD-10-CM

## 2019-09-24 DIAGNOSIS — M9905 Segmental and somatic dysfunction of pelvic region: Secondary | ICD-10-CM | POA: Diagnosis not present

## 2019-09-24 DIAGNOSIS — M62830 Muscle spasm of back: Secondary | ICD-10-CM | POA: Diagnosis not present

## 2019-09-24 DIAGNOSIS — M9903 Segmental and somatic dysfunction of lumbar region: Secondary | ICD-10-CM | POA: Diagnosis not present

## 2019-09-24 DIAGNOSIS — M5416 Radiculopathy, lumbar region: Secondary | ICD-10-CM | POA: Diagnosis not present

## 2019-09-25 ENCOUNTER — Other Ambulatory Visit: Payer: Self-pay

## 2019-09-25 ENCOUNTER — Encounter
Admission: RE | Admit: 2019-09-25 | Discharge: 2019-09-25 | Disposition: A | Discharge status: Home / Self Care | Payer: PPO | Source: Ambulatory Visit | Attending: Pulmonary Disease | Admitting: Pulmonary Disease

## 2019-09-25 NOTE — Patient Instructions (Signed)
Your procedure is scheduled on: 10-01-19 [facility] Report to Same Day Surgery 2nd floor medical mall [facility] Entrance-take elevator on left to 2nd floor.  Check in with surgery information desk.) To find out your arrival time please call [phone] between 1PM - 3PM on 09-28-19 FRIDAY   Remember: Instructions that are not followed completely may result in serious medical risk, up to and including death, or upon the discretion of your surgeon and anesthesiologist your surgery may need to be rescheduled.    _x___ 1. Do not eat food after midnight the night before your procedure. NO GUM OR CANDY AFTER MIDNIGHT. You may drink clear liquids up to 2 hours before you are scheduled to arrive at the hospital for your procedure.  Do not drink clear liquids within 2 hours of your scheduled arrival to the hospital.  Clear liquids include  --Water or Apple juice without pulp  --Gatorade  --Black Coffee or Clear Tea (No milk, no creamers, do not add anything to the coffee or Tea-ok to add sugar)   ____Ensure clear carbohydrate drink on the way to the hospital for bariatric patients  ____Ensure clear carbohydrate drink 3 hours before surgery.    __x__ 2. No Alcohol for 24 hours before or after surgery.   __x__3. No Smoking or e-cigarettes for 24 prior to surgery.  Do not use any chewable tobacco products for at least 6 hour prior to surgery   ____  4. Bring all medications with you on the day of surgery if instructed.    __x__ 5. Notify your doctor if there is any change in your medical condition     (cold, fever, infections).    x___6. On the morning of surgery brush your teeth with toothpaste and water.  You may rinse your mouth with mouth wash if you wish.  Do not swallow any toothpaste or mouthwash.   Do not wear jewelry, make-up, hairpins, clips or nail polish.  Do not wear lotions, powders, or perfumes. You may wear deodorant.  Do not shave 48 hours prior to surgery. Men may shave face  and neck.  Do not bring valuables to the hospital.    [facility] is not responsible for any belongings or valuables.               Contacts, dentures or bridgework may not be worn into surgery.  Leave your suitcase in the car. After surgery it may be brought to your room.  For patients admitted to the hospital, discharge time is determined by your treatment team.  _  Patients discharged the day of surgery will not be allowed to drive home.  You will need someone to drive you home and stay with you the night of your procedure.    Please read over the following fact sheets that you were given:   [facility] Preparing for Surgery  _x___ TAKE THE FOLLOWING MEDICATION THE MORNING OF SURGERY WITH A SMALL SIP OF WATER. These include:  1. NORVASC (AMLODIPINE)  2.  3.  4.  5.  6.  ____Fleets enema or Magnesium Citrate as directed.   ____ Use CHG Soap or sage wipes as directed on instruction sheet   ____ Use inhalers on the day of surgery and bring to hospital day of surgery  ____ Stop Metformin and Janumet 2 days prior to surgery.    ____ Take 1/2 of usual insulin dose the night before surgery and none on the morning surgery.   ____ Follow recommendations  from Cardiologist, Pulmonologist or PCP regarding stopping Aspirin, Coumadin, Plavix ,Eliquis, Effient, or Pradaxa, and Pletal.  X____Stop Anti-inflammatories such as Advil, Aleve, Ibuprofen, Motrin, Naproxen, Naprosyn, Goodies powders or aspirin products NOW-OK to take Tylenol    _x___ Stop supplements until after surgery-STOP CRANBERRY NOW-YOU MAY RESUME AFTER SURGERY   ____ Bring C-Pap to the hospital.

## 2019-09-26 ENCOUNTER — Encounter
Admission: RE | Admit: 2019-09-26 | Discharge: 2019-09-26 | Disposition: A | Discharge status: Home / Self Care | Payer: PPO | Source: Ambulatory Visit | Attending: Pulmonary Disease | Admitting: Pulmonary Disease

## 2019-09-26 ENCOUNTER — Ambulatory Visit
Admission: RE | Admit: 2019-09-26 | Discharge: 2019-09-26 | Disposition: A | Discharge status: Home / Self Care | Payer: PPO | Source: Ambulatory Visit | Attending: Pulmonary Disease | Admitting: Pulmonary Disease

## 2019-09-26 DIAGNOSIS — I10 Essential (primary) hypertension: Secondary | ICD-10-CM | POA: Insufficient documentation

## 2019-09-26 DIAGNOSIS — Z01810 Encounter for preprocedural cardiovascular examination: Secondary | ICD-10-CM | POA: Diagnosis not present

## 2019-09-26 DIAGNOSIS — R911 Solitary pulmonary nodule: Secondary | ICD-10-CM

## 2019-09-26 DIAGNOSIS — R001 Bradycardia, unspecified: Secondary | ICD-10-CM | POA: Insufficient documentation

## 2019-09-26 DIAGNOSIS — Z01818 Encounter for other preprocedural examination: Secondary | ICD-10-CM | POA: Diagnosis not present

## 2019-09-26 LAB — APTT: aPTT: 31 seconds (ref 24–36)

## 2019-09-26 LAB — CBC
HCT: 41.7 % (ref 36.0–46.0)
Hemoglobin: 14.2 g/dL (ref 12.0–15.0)
MCH: 30.3 pg (ref 26.0–34.0)
MCHC: 34.1 g/dL (ref 30.0–36.0)
MCV: 88.9 fL (ref 80.0–100.0)
Platelets: 196 10*3/uL (ref 150–400)
RBC: 4.69 MIL/uL (ref 3.87–5.11)
RDW: 12.4 % (ref 11.5–15.5)
WBC: 6.7 10*3/uL (ref 4.0–10.5)
nRBC: 0 % (ref 0.0–0.2)

## 2019-09-26 LAB — PROTIME-INR
INR: 1 (ref 0.8–1.2)
Prothrombin Time: 12.5 seconds (ref 11.4–15.2)

## 2019-09-27 ENCOUNTER — Other Ambulatory Visit: Payer: Self-pay

## 2019-09-27 ENCOUNTER — Other Ambulatory Visit
Admission: RE | Admit: 2019-09-27 | Discharge: 2019-09-27 | Disposition: A | Discharge status: Home / Self Care | Payer: PPO | Source: Ambulatory Visit | Attending: Pulmonary Disease | Admitting: Pulmonary Disease

## 2019-09-27 DIAGNOSIS — Z01812 Encounter for preprocedural laboratory examination: Secondary | ICD-10-CM | POA: Insufficient documentation

## 2019-09-27 DIAGNOSIS — Z20822 Contact with and (suspected) exposure to covid-19: Secondary | ICD-10-CM | POA: Insufficient documentation

## 2019-09-27 LAB — SARS CORONAVIRUS 2 (TAT 6-24 HRS): SARS Coronavirus 2: NEGATIVE

## 2019-10-01 ENCOUNTER — Other Ambulatory Visit: Payer: Self-pay | Admitting: Pulmonary Disease

## 2019-10-01 ENCOUNTER — Ambulatory Visit: Admission: RE | Admit: 2019-10-01 | Payer: PPO | Source: Ambulatory Visit

## 2019-10-01 ENCOUNTER — Encounter: Admission: RE | Payer: Self-pay | Source: Ambulatory Visit

## 2019-10-01 DIAGNOSIS — R911 Solitary pulmonary nodule: Secondary | ICD-10-CM

## 2019-10-01 SURGERY — VIDEO BRONCHOSCOPY WITH ENDOBRONCHIAL NAVIGATION
Anesthesia: General

## 2019-10-08 DIAGNOSIS — M62830 Muscle spasm of back: Secondary | ICD-10-CM | POA: Diagnosis not present

## 2019-10-08 DIAGNOSIS — M5416 Radiculopathy, lumbar region: Secondary | ICD-10-CM | POA: Diagnosis not present

## 2019-10-08 DIAGNOSIS — M9903 Segmental and somatic dysfunction of lumbar region: Secondary | ICD-10-CM | POA: Diagnosis not present

## 2019-10-08 DIAGNOSIS — M9905 Segmental and somatic dysfunction of pelvic region: Secondary | ICD-10-CM | POA: Diagnosis not present

## 2019-11-05 DIAGNOSIS — M9905 Segmental and somatic dysfunction of pelvic region: Secondary | ICD-10-CM | POA: Diagnosis not present

## 2019-11-05 DIAGNOSIS — M62830 Muscle spasm of back: Secondary | ICD-10-CM | POA: Diagnosis not present

## 2019-11-05 DIAGNOSIS — M9903 Segmental and somatic dysfunction of lumbar region: Secondary | ICD-10-CM | POA: Diagnosis not present

## 2019-11-05 DIAGNOSIS — M5416 Radiculopathy, lumbar region: Secondary | ICD-10-CM | POA: Diagnosis not present

## 2019-11-26 DIAGNOSIS — H16141 Punctate keratitis, right eye: Secondary | ICD-10-CM | POA: Diagnosis not present

## 2019-12-03 DIAGNOSIS — M5416 Radiculopathy, lumbar region: Secondary | ICD-10-CM | POA: Diagnosis not present

## 2019-12-03 DIAGNOSIS — M62830 Muscle spasm of back: Secondary | ICD-10-CM | POA: Diagnosis not present

## 2019-12-03 DIAGNOSIS — M9903 Segmental and somatic dysfunction of lumbar region: Secondary | ICD-10-CM | POA: Diagnosis not present

## 2019-12-03 DIAGNOSIS — M9905 Segmental and somatic dysfunction of pelvic region: Secondary | ICD-10-CM | POA: Diagnosis not present

## 2019-12-21 DIAGNOSIS — L600 Ingrowing nail: Secondary | ICD-10-CM | POA: Diagnosis not present

## 2019-12-21 DIAGNOSIS — B351 Tinea unguium: Secondary | ICD-10-CM | POA: Diagnosis not present

## 2019-12-21 DIAGNOSIS — L03032 Cellulitis of left toe: Secondary | ICD-10-CM | POA: Diagnosis not present

## 2020-01-01 ENCOUNTER — Other Ambulatory Visit: Payer: Self-pay | Admitting: Internal Medicine

## 2020-01-01 DIAGNOSIS — M62830 Muscle spasm of back: Secondary | ICD-10-CM | POA: Diagnosis not present

## 2020-01-01 DIAGNOSIS — M9905 Segmental and somatic dysfunction of pelvic region: Secondary | ICD-10-CM | POA: Diagnosis not present

## 2020-01-01 DIAGNOSIS — M5416 Radiculopathy, lumbar region: Secondary | ICD-10-CM | POA: Diagnosis not present

## 2020-01-01 DIAGNOSIS — M9903 Segmental and somatic dysfunction of lumbar region: Secondary | ICD-10-CM | POA: Diagnosis not present

## 2020-01-04 ENCOUNTER — Other Ambulatory Visit: Payer: Self-pay | Admitting: Internal Medicine

## 2020-01-08 ENCOUNTER — Ambulatory Visit
Admission: RE | Admit: 2020-01-08 | Discharge: 2020-01-08 | Disposition: A | Discharge status: Home / Self Care | Payer: PPO | Source: Ambulatory Visit | Attending: Pulmonary Disease | Admitting: Pulmonary Disease

## 2020-01-08 ENCOUNTER — Other Ambulatory Visit: Payer: Self-pay

## 2020-01-08 DIAGNOSIS — R918 Other nonspecific abnormal finding of lung field: Secondary | ICD-10-CM | POA: Insufficient documentation

## 2020-01-08 DIAGNOSIS — K429 Umbilical hernia without obstruction or gangrene: Secondary | ICD-10-CM | POA: Diagnosis not present

## 2020-01-08 DIAGNOSIS — I700 Atherosclerosis of aorta: Secondary | ICD-10-CM | POA: Insufficient documentation

## 2020-01-08 DIAGNOSIS — R911 Solitary pulmonary nodule: Secondary | ICD-10-CM

## 2020-01-08 DIAGNOSIS — I517 Cardiomegaly: Secondary | ICD-10-CM | POA: Diagnosis not present

## 2020-01-08 LAB — GLUCOSE, CAPILLARY: Glucose-Capillary: 90 mg/dL (ref 70–99)

## 2020-01-08 MED ORDER — FLUDEOXYGLUCOSE F - 18 (FDG) INJECTION
7.2000 | Freq: Once | INTRAVENOUS | Status: AC | PRN
  Administered 2020-01-08: 7.94 via INTRAVENOUS

## 2020-01-14 DIAGNOSIS — N1831 Chronic kidney disease, stage 3a: Secondary | ICD-10-CM | POA: Diagnosis not present

## 2020-01-14 DIAGNOSIS — E785 Hyperlipidemia, unspecified: Secondary | ICD-10-CM | POA: Diagnosis not present

## 2020-01-14 DIAGNOSIS — I10 Essential (primary) hypertension: Secondary | ICD-10-CM | POA: Diagnosis not present

## 2020-01-14 DIAGNOSIS — M8589 Other specified disorders of bone density and structure, multiple sites: Secondary | ICD-10-CM | POA: Diagnosis not present

## 2020-01-17 DIAGNOSIS — C342 Malignant neoplasm of middle lobe, bronchus or lung: Secondary | ICD-10-CM | POA: Insufficient documentation

## 2020-01-17 DIAGNOSIS — R911 Solitary pulmonary nodule: Secondary | ICD-10-CM | POA: Diagnosis not present

## 2020-01-18 ENCOUNTER — Encounter: Payer: Self-pay | Admitting: *Deleted

## 2020-01-18 NOTE — Progress Notes (Signed)
  Oncology Nurse Navigator Documentation  Navigator Location: [facility]Med Onc (01/18/20 0900) Referral Date to RadOnc/MedOnc: 01/17/20 (01/18/20 0900) )Navigator Encounter Type: Introductory Phone Call (01/18/20 0900)   Abnormal Finding Date: 01/09/20 (01/18/20 0900)                   Treatment Phase: Abnormal Scans (01/18/20 0900) Barriers/Navigation Needs: Coordination of Care (01/18/20 0900)   Interventions: Coordination of Care (01/18/20 0900)   Coordination of Care: Appts (01/18/20 0900)     phone call made to patient to review upcoming appts and introduce to navigator services. All questions answered during visit. Contact info given and instructed to call with any further questions or needs. Pt verbalized understanding. Nothing further needed at this time.             Time Spent with Patient: 30 (01/18/20 0900)

## 2020-01-21 ENCOUNTER — Other Ambulatory Visit: Payer: Self-pay | Admitting: *Deleted

## 2020-01-21 DIAGNOSIS — E785 Hyperlipidemia, unspecified: Secondary | ICD-10-CM | POA: Diagnosis not present

## 2020-01-21 DIAGNOSIS — N1831 Chronic kidney disease, stage 3a: Secondary | ICD-10-CM | POA: Diagnosis not present

## 2020-01-21 DIAGNOSIS — R911 Solitary pulmonary nodule: Secondary | ICD-10-CM

## 2020-01-21 DIAGNOSIS — N390 Urinary tract infection, site not specified: Secondary | ICD-10-CM | POA: Diagnosis not present

## 2020-01-21 DIAGNOSIS — M8589 Other specified disorders of bone density and structure, multiple sites: Secondary | ICD-10-CM | POA: Diagnosis not present

## 2020-01-21 DIAGNOSIS — L600 Ingrowing nail: Secondary | ICD-10-CM | POA: Diagnosis not present

## 2020-01-21 DIAGNOSIS — Z0000 Encounter for general adult medical examination without abnormal findings: Secondary | ICD-10-CM | POA: Diagnosis not present

## 2020-01-21 DIAGNOSIS — I10 Essential (primary) hypertension: Secondary | ICD-10-CM | POA: Diagnosis not present

## 2020-01-22 ENCOUNTER — Other Ambulatory Visit: Payer: Self-pay | Admitting: Internal Medicine

## 2020-01-22 DIAGNOSIS — Z1231 Encounter for screening mammogram for malignant neoplasm of breast: Secondary | ICD-10-CM

## 2020-01-23 ENCOUNTER — Institutional Professional Consult (permissible substitution): Payer: PPO | Admitting: Radiation Oncology

## 2020-01-23 ENCOUNTER — Ambulatory Visit: Payer: PPO | Admitting: Oncology

## 2020-01-23 ENCOUNTER — Other Ambulatory Visit: Payer: PPO

## 2020-01-24 ENCOUNTER — Ambulatory Visit
Admission: RE | Admit: 2020-01-24 | Discharge: 2020-01-24 | Disposition: A | Discharge status: Home / Self Care | Payer: PPO | Source: Ambulatory Visit | Attending: Radiation Oncology | Admitting: Radiation Oncology

## 2020-01-24 ENCOUNTER — Inpatient Hospital Stay: Payer: PPO

## 2020-01-24 ENCOUNTER — Encounter: Payer: PPO | Admitting: Thoracic Surgery (Cardiothoracic Vascular Surgery)

## 2020-01-24 ENCOUNTER — Inpatient Hospital Stay: Payer: PPO | Attending: Oncology | Admitting: Oncology

## 2020-01-24 ENCOUNTER — Other Ambulatory Visit: Payer: Self-pay

## 2020-01-24 ENCOUNTER — Encounter: Payer: Self-pay | Admitting: Oncology

## 2020-01-24 ENCOUNTER — Encounter: Payer: Self-pay | Admitting: *Deleted

## 2020-01-24 VITALS — BP 151/72 | HR 76 | Temp 97.3°F | Resp 16 | Wt 142.3 lb

## 2020-01-24 DIAGNOSIS — Z86010 Personal history of colonic polyps: Secondary | ICD-10-CM | POA: Insufficient documentation

## 2020-01-24 DIAGNOSIS — N189 Chronic kidney disease, unspecified: Secondary | ICD-10-CM | POA: Insufficient documentation

## 2020-01-24 DIAGNOSIS — Z803 Family history of malignant neoplasm of breast: Secondary | ICD-10-CM | POA: Insufficient documentation

## 2020-01-24 DIAGNOSIS — I129 Hypertensive chronic kidney disease with stage 1 through stage 4 chronic kidney disease, or unspecified chronic kidney disease: Secondary | ICD-10-CM | POA: Insufficient documentation

## 2020-01-24 DIAGNOSIS — Z79899 Other long term (current) drug therapy: Secondary | ICD-10-CM | POA: Diagnosis not present

## 2020-01-24 DIAGNOSIS — K118 Other diseases of salivary glands: Secondary | ICD-10-CM

## 2020-01-24 DIAGNOSIS — D37030 Neoplasm of uncertain behavior of the parotid salivary glands: Secondary | ICD-10-CM | POA: Insufficient documentation

## 2020-01-24 DIAGNOSIS — R911 Solitary pulmonary nodule: Secondary | ICD-10-CM | POA: Insufficient documentation

## 2020-01-24 NOTE — Consult Note (Signed)
NEW PATIENT EVALUATION  Name: Melissa Harvey  MRN: [id number]  Date:   01/24/2020     DOB: Jun 08, 1940   This 79 y.o. female patient presents to the clinic for initial evaluation of presumed stage I non-small cell lung cancer of the right middle lobe.  REFERRING PHYSICIAN: Adin Hector, MD  CHIEF COMPLAINT:  Chief Complaint  Patient presents with  . Lung Cancer    INitial consultation    DIAGNOSIS: The encounter diagnosis was Lung nodule.   PREVIOUS INVESTIGATIONS:  CT scans and PET CT scans reviewed Clinical notes reviewed Labs reviewed  HPI: Patient is a 79 year old female who as part of a work-up for a vascular issue had a chest CT scan showing a solitary nodule in the right middle lobe.  Back in April 2021.  This lesion measured 1.4 cm and was indeterminate.  On PET CT scan last month the right middle lobe nodule was hypermetabolic indicative of a stage Ia primary bronchogenic carcinoma.  She had some extremely small other pulmonary nodules too small for PET resolution no mediastinal adenopathy.  She had a right parotid nodule also noted which was slightly hyper Mehta hypermetabolic.  She is a non-smoker she specifically denies cough hemoptysis or chest tightness.  She is seen today for radiation collagen consultation accompanied by her daughter.  PLANNED TREATMENT REGIMEN: SBRT versus surgery  PAST MEDICAL HISTORY:  has a past medical history of Allergic rhinitis, Chronic kidney disease, Colon polyps, and Hypertension.    PAST SURGICAL HISTORY:  Past Surgical History:  Procedure Laterality Date  . COLONOSCOPY WITH PROPOFOL N/A 12/07/2018   Procedure: COLONOSCOPY WITH PROPOFOL;  Surgeon: Lollie Sails, MD;  Location: [facility] END[facility]COPY;  Service: Endoscopy;  Laterality: N/A;  . TONSILLECTOMY      FAMILY HISTORY: family history includes Breast cancer (age of onset: 49) in her maternal grandmother.  SOCIAL HISTORY:  reports that she has never smoked. She has never used  smokeless tobacco. She reports that she does not drink alcohol and does not use drugs.  ALLERGIES: Patient has no known allergies.  MEDICATIONS:  Current Outpatient Medications  Medication Sig Dispense Refill  . acetaminophen (TYLENOL) 500 MG tablet Take 500 mg by mouth every 6 (six) hours as needed.    Marland Kitchen amLODipine (NORVASC) 2.5 MG tablet Take 2.5 mg by mouth every morning.     . Calcium Carbonate-Vitamin D (CALCIUM + D PO) Take 1 tablet by mouth daily.     . Cholecalciferol (VITAMIN D) 50 MCG (2000 UT) tablet Take 2,000 Units by mouth daily.    Marland Kitchen CRANBERRY PO Take 1,500 mg by mouth daily.     . Probiotic Product (PROBIOTIC DAILY PO) Take 1 capsule by mouth daily.     No current facility-administered medications for this encounter.    ECOG PERFORMANCE STATUS:  0 - Asymptomatic  REVIEW OF SYSTEMS: Patient denies any weight loss, fatigue, weakness, fever, chills or night sweats. Patient denies any loss of vision, blurred vision. Patient denies any ringing  of the ears or hearing loss. No irregular heartbeat. Patient denies heart murmur or history of fainting. Patient denies any chest pain or pain radiating to her upper extremities. Patient denies any shortness of breath, difficulty breathing at night, cough or hemoptysis. Patient denies any swelling in the lower legs. Patient denies any nausea vomiting, vomiting of blood, or coffee ground material in the vomitus. Patient denies any stomach pain. Patient states has had normal bowel movements no significant constipation or diarrhea.  Patient denies any dysuria, hematuria or significant nocturia. Patient denies any problems walking, swelling in the joints or loss of balance. Patient denies any skin changes, loss of hair or loss of weight. Patient denies any excessive worrying or anxiety or significant depression. Patient denies any problems with insomnia. Patient denies excessive thirst, polyuria, polydipsia. Patient denies any swollen glands, patient  denies easy bruising or easy bleeding. Patient denies any recent infections, allergies or URI. Patient "s visual fields have not changed significantly in recent time.   PHYSICAL EXAM: There were no vitals taken for this visit. Well-developed well-nourished patient in NAD. HEENT reveals PERLA, EOMI, discs not visualized.  Oral cavity is clear. No oral mucosal lesions are identified. Neck is clear without evidence of cervical or supraclavicular adenopathy. Lungs are clear to A&P. Cardiac examination is essentially unremarkable with regular rate and rhythm without murmur rub or thrill. Abdomen is benign with no organomegaly or masses noted. Motor sensory and DTR levels are equal and symmetric in the upper and lower extremities. Cranial nerves II through XII are grossly intact. Proprioception is intact. No peripheral adenopathy or edema is identified. No motor or sensory levels are noted. Crude visual fields are within normal range.  LABORATORY DATA: Labs reviewed   RADIOLOGY RESULTS: CT scans and PET CT scans reviewed compatible with above-stated findings   IMPRESSION: Stage I non-small cell lung cancer of the right middle lobe in 79 year old female  PLAN: At this time of gone over recommendations of surgery versus SBRT treatment.  I have gone over the risk and benefits of SBRT which in our treatment experience has approximate 90 to 95% chance of curative effectiveness.  Side effect profile is extremely small with possible development of cough about a month out from treatment.  I would plan on delivering 60 Gy in 5 fractions.  Patient will meet with thoracic surgeon today to discuss surgical options.  I believe she is leaning towards SBRT treatment.  After discussion with thoracic surgery she will make a final decision.  Patient knows to call with any concerns.  I would like to take this opportunity to thank you for allowing me to participate in the care of your patient.Noreene Filbert,  MD

## 2020-01-24 NOTE — Progress Notes (Signed)
Hematology/Oncology Consult note [facility] Telephone:([phone] [phone]  Patient Care Team: Adin Hector, MD as PCP - General (Internal Medicine) Noreene Filbert, MD as Radiation Oncologist (Radiation Oncology) Telford Nab, RN as Oncology Nurse Navigator   Name of the patient: Melissa Harvey  [id number]  1941-01-21    Reason for referral-lung nodule   Referring physician-Dr. Lanney Gins  Date of visit: 01/24/20   History of presenting illness- Patient is a 79 year old female, never smoker who saw Dr. Synetta Shadow for lung nodule which has been followed.  Recently she underwent a head CT scan which showed 10 x 12 mm right parotid nodule with an SUV of 3.7 as well as a right middle lobe nodule 11 x 13 mm with an SUV of 4.3.  No other evidence of distant disease or locoregional adenopathy.  She has not had any tissue diagnosis yet and referred for further management.  Patient will also be seeing radiation oncology as well as cardiothoracic surgery today.  She is here with her daughter today and she is doing very well for her age and is independent of her ADLs and IADLs.  ECOG PS- 1  Pain scale- 0   Review of systems- Review of Systems  Constitutional: Negative for chills, fever, malaise/fatigue and weight loss.  HENT: Negative for congestion, ear discharge and nosebleeds.   Eyes: Negative for blurred vision.  Respiratory: Negative for cough, hemoptysis, sputum production, shortness of breath and wheezing.   Cardiovascular: Negative for chest pain, palpitations, orthopnea and claudication.  Gastrointestinal: Negative for abdominal pain, blood in stool, constipation, diarrhea, heartburn, melena, nausea and vomiting.  Genitourinary: Negative for dysuria, flank pain, frequency, hematuria and urgency.  Musculoskeletal: Negative for back pain, joint pain and myalgias.  Skin: Negative for rash.  Neurological: Negative for dizziness, tingling,  focal weakness, seizures, weakness and headaches.  Endo/Heme/Allergies: Does not bruise/bleed easily.  Psychiatric/Behavioral: Negative for depression and suicidal ideas. The patient does not have insomnia.     No Known Allergies  There are no problems to display for this patient.    Past Medical History:  Diagnosis Date  . Allergic rhinitis   . Chronic kidney disease    CKD - Stage 3  . Colon polyps   . Hypertension      Past Surgical History:  Procedure Laterality Date  . COLONOSCOPY WITH PROPOFOL N/A 12/07/2018   Procedure: COLONOSCOPY WITH PROPOFOL;  Surgeon: Lollie Sails, MD;  Location: [facility] ENDOSCOPY;  Service: Endoscopy;  Laterality: N/A;  . TONSILLECTOMY      Social History   Socioeconomic History  . Marital status: Married    Spouse name: Not on file  . Number of children: Not on file  . Years of education: Not on file  . Highest education level: Not on file  Occupational History  . Not on file  Tobacco Use  . Smoking status: Never Smoker  . Smokeless tobacco: Never Used  Substance and Sexual Activity  . Alcohol use: No  . Drug use: No  . Sexual activity: Not on file  Other Topics Concern  . Not on file  Social History Narrative  . Not on file   Social Determinants of Health   Financial Resource Strain:   . Difficulty of Paying Living Expenses: Not on file  Food Insecurity:   . Worried About Charity fundraiser in the Last Year: Not on file  . Ran Out of Food in the Last Year: Not on file  Transportation Needs:   . Film/video editor (Medical): Not on file  . Lack of Transportation (Non-Medical): Not on file  Physical Activity:   . Days of Exercise per Week: Not on file  . Minutes of Exercise per Session: Not on file  Stress:   . Feeling of Stress : Not on file  Social Connections:   . Frequency of Communication with Friends and Family: Not on file  . Frequency of Social Gatherings with Friends and Family: Not on file  . Attends  Religious Services: Not on file  . Active Member of Clubs or Organizations: Not on file  . Attends Archivist Meetings: Not on file  . Marital Status: Not on file  Intimate Partner Violence:   . Fear of Current or Ex-Partner: Not on file  . Emotionally Abused: Not on file  . Physically Abused: Not on file  . Sexually Abused: Not on file     Family History  Problem Relation Age of Onset  . Breast cancer Maternal Grandmother 90     Current Outpatient Medications:  .  acetaminophen (TYLENOL) 500 MG tablet, Take 500 mg by mouth every 6 (six) hours as needed., Disp: , Rfl:  .  amLODipine (NORVASC) 2.5 MG tablet, Take 2.5 mg by mouth every morning. , Disp: , Rfl:  .  Calcium Carbonate-Vitamin D (CALCIUM + D PO), Take 1 tablet by mouth daily. , Disp: , Rfl:  .  Cholecalciferol (VITAMIN D) 50 MCG (2000 UT) tablet, Take 2,000 Units by mouth daily., Disp: , Rfl:  .  CRANBERRY PO, Take 1,500 mg by mouth daily. , Disp: , Rfl:  .  Probiotic Product (PROBIOTIC DAILY PO), Take 1 capsule by mouth daily., Disp: , Rfl:    Physical exam: There were no vitals filed for this visit. Physical Exam Constitutional:      General: She is not in acute distress. Cardiovascular:     Rate and Rhythm: Normal rate and regular rhythm.     Heart sounds: Normal heart sounds.  Pulmonary:     Effort: Pulmonary effort is normal.     Breath sounds: Normal breath sounds.  Abdominal:     General: Bowel sounds are normal.     Palpations: Abdomen is soft.  Skin:    General: Skin is warm and dry.  Neurological:     Mental Status: She is alert and oriented to person, place, and time.        No flowsheet data found. CBC Latest Ref Rng & Units 09/26/2019  WBC 4.0 - 10.5 K/uL 6.7  Hemoglobin 12.0 - 15.0 g/dL 14.2  Hematocrit 36 - 46 % 41.7  Platelets 150 - 400 K/uL 196    No images are attached to the encounter.  NM PET Image Initial (PI) Skull Base To Thigh  Result Date: 01/09/2020 CLINICAL  DATA:  Initial treatment strategy for lung nodule. EXAM: NUCLEAR MEDICINE PET SKULL BASE TO THIGH TECHNIQUE: 7.9 mCi F-18 FDG was injected intravenously. Full-ring PET imaging was performed from the skull base to thigh after the radiotracer. CT data was obtained and used for attenuation correction and anatomic localization. Fasting blood glucose: 90 mg/dl COMPARISON:  CT chest 09/26/2019, 07/31/2019. FINDINGS: Mediastinal blood pool activity: SUV max 2.3 Liver activity: SUV max NA NECK: Focal hypermetabolism associated with a 10 x 12 mm right parotid nodule (3/27) has an SUV max of 3.7. No hypermetabolic lymph nodes. Incidental CT findings: None. CHEST: No hypermetabolic mediastinal, hilar or axillary lymph nodes. 12  mm right middle lobe nodule (11 x 13 mm, 2/536) is hypermetabolic, SUV max 4.3. Nodule is stable in size from baseline examination 07/31/2019. Other millimetric pulmonary nodules are too small for PET resolution. Incidental CT findings: Atherosclerotic calcification of the aorta. Heart is enlarged. No pericardial or pleural effusion. Additional millimetric pulmonary nodules are unchanged. ABDOMEN/PELVIS: No abnormal hypermetabolism in the liver, adrenal glands, spleen or pancreas. No hypermetabolic lymph nodes. Incidental CT findings: Liver, gallbladder and adrenal glands are unremarkable. Low-attenuation lesion off the lower pole right kidney measures 4.8 cm and is likely a cyst. Spleen, pancreas, stomach and bowel are grossly unremarkable. Small umbilical hernia contains fat. Atherosclerotic calcification of the aorta. No free fluid. SKELETON: No abnormal hypermetabolism. Incidental CT findings: Degenerative changes in the spine. IMPRESSION: 1. Hypermetabolic right middle lobe nodule, indicative of stage IA primary bronchogenic carcinoma. 2. Additional millimetric pulmonary nodules are too small for PET resolution. 3. Hypermetabolic right parotid nodule. Malignancy cannot be excluded. 4.  Aortic  atherosclerosis (ICD10-I70.0). Electronically Signed   By: Lorin Picket M.D.   On: 01/09/2020 08:41    Assessment and plan- Patient is a 79 y.o. female referred for right middle lobe lung nodule  I have reviewed PET/CT scan images independently and discussed findings with the patient.  1.  Right parotid nodule.  She will need ENT evaluation for this which can be done in the next 1 to 2 months after her lung nodule is completely evaluated  2.  Right middle lobe lung nodule 11 x 13 mm.  This was hypermetabolic on PET CT scan concerning for bronchogenic carcinoma.  There is no evidence of locoregional adenopathy or distant metastatic disease.  Options at this time would be surgical removal of this lung nodule with a wedge resection without prior tissue diagnosis versus if she does not consider surgical treatment bronchoscopy guided biopsy is an option followed by consideration for SBRT.  There will be no role for systemic treatment at this time and we would need tissue diagnosis prior anyways.  Patient is meeting with cardiothoracic surgery at [facility] to discuss surgical options.  Her baseline PFTs were good. She is also meeting Dr. Donella Stade from radiation oncology to discuss nonsurgical management  She does not require follow-up with me as such in the future and her lung nodule can be eventually followed either by cardiothoracic surgery or radiation oncology  Thank you for this kind referral and the opportunity to participate in the care of this patient   Visit Diagnosis 1. Nodule of middle lobe of right lung   2. Parotid nodule     Dr. Randa Evens, MD, MPH [facility] [phone] 01/24/2020  2:58 PM

## 2020-01-25 NOTE — Progress Notes (Signed)
  Oncology Nurse Navigator Documentation  Navigator Location: [facility]-Med Onc (01/25/20 1200)   )Navigator Encounter Type: Initial MedOnc;Initial RadOnc (01/25/20 1200)                         Barriers/Navigation Needs: Coordination of Care (01/25/20 1200)   Interventions: Coordination of Care (01/25/20 1200)   Coordination of Care: Appts (01/25/20 1200)        Acuity: Level 2-Minimal Needs (1-2 Barriers Identified) (01/25/20 1200)    met with patient during initial consult with Dr. Janese Banks and Dr. Baruch Gouty. All questions answered during visit. Reviewed upcoming appts with pt and her daughter. Contact info given and instructed to call with any further questions or needs. Pt verbalized understanding.      Time Spent with Patient: 90 (01/25/20 1200)

## 2020-01-29 DIAGNOSIS — M62830 Muscle spasm of back: Secondary | ICD-10-CM | POA: Diagnosis not present

## 2020-01-29 DIAGNOSIS — M9905 Segmental and somatic dysfunction of pelvic region: Secondary | ICD-10-CM | POA: Diagnosis not present

## 2020-01-29 DIAGNOSIS — M9903 Segmental and somatic dysfunction of lumbar region: Secondary | ICD-10-CM | POA: Diagnosis not present

## 2020-01-29 DIAGNOSIS — M5416 Radiculopathy, lumbar region: Secondary | ICD-10-CM | POA: Diagnosis not present

## 2020-01-30 ENCOUNTER — Other Ambulatory Visit: Payer: Self-pay

## 2020-01-30 ENCOUNTER — Inpatient Hospital Stay (HOSPITAL_BASED_OUTPATIENT_CLINIC_OR_DEPARTMENT_OTHER): Payer: PPO | Admitting: Hospice and Palliative Medicine

## 2020-01-30 DIAGNOSIS — R911 Solitary pulmonary nodule: Secondary | ICD-10-CM

## 2020-01-30 NOTE — Progress Notes (Signed)
Multidisciplinary Oncology Council Documentation  Melissa Harvey was presented by our [facility] on 01/30/2020, which included representatives from:  . Palliative Care . Dietitian . Physical/Occupational Therapist . Speech Therapist . Survivorship Nurse . Nurse Navigator . Social work . Research RN . Genetics    Melissa Harvey currently presents with history of lung cancer  We reviewed previous medical and familial history, history of present illness, and recent lab results along with all available histopathologic and imaging studies. The [facility] considered available treatment options and made the following recommendations/referrals: Orders Placed This Encounter  Procedures  . Ambulatory referral to Oncology Navigator    The [facility] is a meeting of clinicians from various specialty areas who evaluate and discuss patients for whom a multidisciplinary approach is being considered. Final determinations in the plan of care are those of the provider(s).   Today's extended care, comprehensive team conference, Melissa Harvey was not present for the discussion and was not examined.

## 2020-02-07 ENCOUNTER — Ambulatory Visit
Admission: RE | Admit: 2020-02-07 | Discharge: 2020-02-07 | Disposition: A | Discharge status: Home / Self Care | Payer: PPO | Source: Ambulatory Visit | Attending: Internal Medicine | Admitting: Internal Medicine

## 2020-02-07 ENCOUNTER — Ambulatory Visit
Admission: RE | Admit: 2020-02-07 | Discharge: 2020-02-07 | Disposition: A | Discharge status: Home / Self Care | Payer: PPO | Source: Ambulatory Visit | Attending: Radiation Oncology | Admitting: Radiation Oncology

## 2020-02-07 ENCOUNTER — Encounter: Payer: Self-pay | Admitting: *Deleted

## 2020-02-07 ENCOUNTER — Other Ambulatory Visit: Payer: Self-pay

## 2020-02-07 DIAGNOSIS — R911 Solitary pulmonary nodule: Secondary | ICD-10-CM | POA: Diagnosis not present

## 2020-02-07 DIAGNOSIS — Z1231 Encounter for screening mammogram for malignant neoplasm of breast: Secondary | ICD-10-CM | POA: Diagnosis not present

## 2020-02-07 DIAGNOSIS — C3490 Malignant neoplasm of unspecified part of unspecified bronchus or lung: Secondary | ICD-10-CM

## 2020-02-07 HISTORY — DX: Malignant (primary) neoplasm, unspecified: C80.1

## 2020-02-07 NOTE — Progress Notes (Signed)
  Oncology Nurse Navigator Documentation  Navigator Location: [facility]-Med Onc (02/07/20 1400)   )Navigator Encounter Type: Lobby (02/07/20 1400)                     Patient Visit Type: [facility] (02/07/20 1400) Treatment Phase: Pre-Tx/Tx Discussion (02/07/20 1400) Barriers/Navigation Needs: Coordination of Care (02/07/20 1400)   Interventions: Coordination of Care (02/07/20 1400)   Coordination of Care: Appts (02/07/20 1400)           met patient in the lobby after completing simulation today. All questions answered during visit. Instructed pt to call with any further questions or needs. Pt verbalized understanding. Nothing further needed at this time. \  Per Dr. Janese Banks, pt will need follow up CT scan 2 months after completing radiation treatment. Will order/schedule follow up imaging and visit for pt. Will notify pt at next visit. Per previous visit, pt needs referral to ENT for parotid nodule seen on recent imaging. Referral faxed to [facility] attn: Dr. Tami Ribas.        Time Spent with Patient: 30 (02/07/20 1400)

## 2020-02-12 DIAGNOSIS — R911 Solitary pulmonary nodule: Secondary | ICD-10-CM | POA: Diagnosis not present

## 2020-02-15 DIAGNOSIS — D37030 Neoplasm of uncertain behavior of the parotid salivary glands: Secondary | ICD-10-CM | POA: Diagnosis not present

## 2020-02-18 ENCOUNTER — Ambulatory Visit
Admission: RE | Admit: 2020-02-18 | Discharge: 2020-02-18 | Disposition: A | Discharge status: Home / Self Care | Payer: PPO | Source: Ambulatory Visit | Attending: Radiation Oncology | Admitting: Radiation Oncology

## 2020-02-18 ENCOUNTER — Encounter: Payer: Self-pay | Admitting: *Deleted

## 2020-02-18 DIAGNOSIS — R911 Solitary pulmonary nodule: Secondary | ICD-10-CM | POA: Diagnosis not present

## 2020-02-18 NOTE — Progress Notes (Signed)
  Oncology Nurse Navigator Documentation  Navigator Location: [facility] Onc (02/18/20 1500)   )Navigator Encounter Type: Appt/Treatment Plan Review (02/18/20 1500)                       Treatment Phase: First Radiation Tx (02/18/20 1500) Barriers/Navigation Needs: No Barriers At This Time (02/18/20 1500)   Interventions: None Required (02/18/20 1500)               chart reviewed. Pt started SBRT today. Follow up with Dr. Janese Banks including follow up CT scan scheduled for late Jan 2022. Pt should have received print out of appts when arrived for radiation today. Nothing further needed at this time.       Time Spent with Patient: 15 (02/18/20 1500)

## 2020-02-19 ENCOUNTER — Other Ambulatory Visit: Payer: Self-pay | Admitting: Otolaryngology

## 2020-02-19 DIAGNOSIS — K118 Other diseases of salivary glands: Secondary | ICD-10-CM

## 2020-02-20 ENCOUNTER — Ambulatory Visit
Admission: RE | Admit: 2020-02-20 | Discharge: 2020-02-20 | Disposition: A | Discharge status: Home / Self Care | Payer: PPO | Source: Ambulatory Visit | Attending: Radiation Oncology | Admitting: Radiation Oncology

## 2020-02-20 DIAGNOSIS — R911 Solitary pulmonary nodule: Secondary | ICD-10-CM | POA: Diagnosis not present

## 2020-02-22 ENCOUNTER — Other Ambulatory Visit: Payer: Self-pay | Admitting: Student

## 2020-02-25 ENCOUNTER — Ambulatory Visit
Admission: RE | Admit: 2020-02-25 | Discharge: 2020-02-25 | Disposition: A | Discharge status: Home / Self Care | Payer: PPO | Source: Ambulatory Visit | Attending: Otolaryngology | Admitting: Otolaryngology

## 2020-02-25 ENCOUNTER — Other Ambulatory Visit: Payer: Self-pay

## 2020-02-25 ENCOUNTER — Ambulatory Visit
Admission: RE | Admit: 2020-02-25 | Discharge: 2020-02-25 | Disposition: A | Discharge status: Home / Self Care | Payer: PPO | Source: Ambulatory Visit | Attending: Radiation Oncology | Admitting: Radiation Oncology

## 2020-02-25 DIAGNOSIS — K118 Other diseases of salivary glands: Secondary | ICD-10-CM | POA: Insufficient documentation

## 2020-02-25 DIAGNOSIS — R911 Solitary pulmonary nodule: Secondary | ICD-10-CM | POA: Diagnosis not present

## 2020-02-25 DIAGNOSIS — R918 Other nonspecific abnormal finding of lung field: Secondary | ICD-10-CM | POA: Diagnosis not present

## 2020-02-25 DIAGNOSIS — D37030 Neoplasm of uncertain behavior of the parotid salivary glands: Secondary | ICD-10-CM | POA: Diagnosis not present

## 2020-02-25 NOTE — Discharge Instructions (Signed)

## 2020-02-25 NOTE — Procedures (Signed)
  Procedure: [place] FNA bx R parotid nodule quick stain adequate EBL:   minimal Complications:  none immediate  See full dictation in [facility].  Dillard Cannon MD Main # [phone] Pager  [phone] Mobile [phone]

## 2020-02-26 DIAGNOSIS — M9905 Segmental and somatic dysfunction of pelvic region: Secondary | ICD-10-CM | POA: Diagnosis not present

## 2020-02-26 DIAGNOSIS — M9903 Segmental and somatic dysfunction of lumbar region: Secondary | ICD-10-CM | POA: Diagnosis not present

## 2020-02-26 DIAGNOSIS — M62830 Muscle spasm of back: Secondary | ICD-10-CM | POA: Diagnosis not present

## 2020-02-26 DIAGNOSIS — M5416 Radiculopathy, lumbar region: Secondary | ICD-10-CM | POA: Diagnosis not present

## 2020-02-27 ENCOUNTER — Ambulatory Visit
Admission: RE | Admit: 2020-02-27 | Discharge: 2020-02-27 | Disposition: A | Discharge status: Home / Self Care | Payer: PPO | Source: Ambulatory Visit | Attending: Radiation Oncology | Admitting: Radiation Oncology

## 2020-02-27 DIAGNOSIS — R911 Solitary pulmonary nodule: Secondary | ICD-10-CM | POA: Diagnosis not present

## 2020-02-27 LAB — CYTOLOGY - NON PAP

## 2020-03-03 ENCOUNTER — Ambulatory Visit
Admission: RE | Admit: 2020-03-03 | Discharge: 2020-03-03 | Disposition: A | Discharge status: Home / Self Care | Payer: PPO | Source: Ambulatory Visit | Attending: Radiation Oncology | Admitting: Radiation Oncology

## 2020-03-03 DIAGNOSIS — H00014 Hordeolum externum left upper eyelid: Secondary | ICD-10-CM | POA: Diagnosis not present

## 2020-03-03 DIAGNOSIS — R911 Solitary pulmonary nodule: Secondary | ICD-10-CM | POA: Diagnosis not present

## 2020-03-05 ENCOUNTER — Ambulatory Visit: Payer: PPO

## 2020-03-10 ENCOUNTER — Ambulatory Visit: Payer: PPO

## 2020-03-17 DIAGNOSIS — M9905 Segmental and somatic dysfunction of pelvic region: Secondary | ICD-10-CM | POA: Diagnosis not present

## 2020-03-17 DIAGNOSIS — M9903 Segmental and somatic dysfunction of lumbar region: Secondary | ICD-10-CM | POA: Diagnosis not present

## 2020-03-17 DIAGNOSIS — M5416 Radiculopathy, lumbar region: Secondary | ICD-10-CM | POA: Diagnosis not present

## 2020-03-17 DIAGNOSIS — M62830 Muscle spasm of back: Secondary | ICD-10-CM | POA: Diagnosis not present

## 2020-03-25 DIAGNOSIS — M9903 Segmental and somatic dysfunction of lumbar region: Secondary | ICD-10-CM | POA: Diagnosis not present

## 2020-03-25 DIAGNOSIS — M5416 Radiculopathy, lumbar region: Secondary | ICD-10-CM | POA: Diagnosis not present

## 2020-03-25 DIAGNOSIS — M62830 Muscle spasm of back: Secondary | ICD-10-CM | POA: Diagnosis not present

## 2020-03-25 DIAGNOSIS — M9905 Segmental and somatic dysfunction of pelvic region: Secondary | ICD-10-CM | POA: Diagnosis not present

## 2020-03-26 DIAGNOSIS — D37030 Neoplasm of uncertain behavior of the parotid salivary glands: Secondary | ICD-10-CM | POA: Diagnosis not present

## 2020-04-01 ENCOUNTER — Other Ambulatory Visit: Payer: Self-pay

## 2020-04-01 ENCOUNTER — Encounter
Admission: RE | Admit: 2020-04-01 | Discharge: 2020-04-01 | Disposition: A | Discharge status: Home / Self Care | Payer: PPO | Source: Ambulatory Visit | Attending: Otolaryngology | Admitting: Otolaryngology

## 2020-04-01 DIAGNOSIS — Z01818 Encounter for other preprocedural examination: Secondary | ICD-10-CM | POA: Insufficient documentation

## 2020-04-01 NOTE — Patient Instructions (Signed)
Your procedure is scheduled on:04-09-20 [facility] Report to the Registration Desk on the 1st floor of the [facility]-Then proceed to the 2nd floor Surgery Desk in the [place] To find out your arrival time, please call [phone] between 1PM - 3PM on:04-08-20 TUESDAY  REMEMBER: Instructions that are not followed completely may result in serious medical risk, up to and including death; or upon the discretion of your surgeon and anesthesiologist your surgery may need to be rescheduled.  Do not eat food after midnight the night before surgery.  No gum chewing, lozengers or hard candies.  You may however, drink CLEAR liquids up to 2 hours before you are scheduled to arrive for your surgery. Do not drink anything within 2 hours of your scheduled arrival time.  Clear liquids include: - water  - apple juice without pulp - gatorade (not RED, PURPLE, OR BLUE) - black coffee or tea (Do NOT add milk or creamers to the coffee or tea) Do NOT drink anything that is not on this list.  TAKE THESE MEDICATIONS THE MORNING OF SURGERY WITH A SIP OF WATER:  -NORVASC (AMLODIPINE)  One week prior to surgery: Stop Anti-inflammatories (NSAIDS) such as Advil, Aleve, Ibuprofen, Motrin, Naproxen, Naprosyn and Aspirin based products such as Excedrin, Goodys Powder, BC Powder-OK TO TAKE TYLENOL IF NEEDED  Stop ANY OVER THE COUNTER supplements until after surgery-STOP CRANBERRY NOW-YOU MAY RESUME AFTER SURGERY (However, you may continue taking Vitamin D, Calcium and Probiotic up until the day before surgery.)  No Alcohol for 24 hours before or after surgery.  No Smoking including e-cigarettes for 24 hours prior to surgery.  No chewable tobacco products for at least 6 hours prior to surgery.  No nicotine patches on the day of surgery.  Do not use any "recreational" drugs for at least a week prior to your surgery.  Please be advised that the combination of cocaine and anesthesia may have negative  outcomes, up to and including death. If you test positive for cocaine, your surgery will be cancelled.  On the morning of surgery brush your teeth with toothpaste and water, you may rinse your mouth with mouthwash if you wish. Do not swallow any toothpaste or mouthwash.  Do not wear jewelry, make-up, hairpins, clips or nail polish.  Do not wear lotions, powders, or perfumes.   Do not shave body from the neck down 48 hours prior to surgery just in case you cut yourself which could leave a site for infection.  Also, freshly shaved skin may become irritated if using the CHG soap.  Contact lenses, hearing aids and dentures may not be worn into surgery.  Do not bring valuables to the hospital. [facility] is not responsible for any missing/lost belongings or valuables.   Use CHG Soap as directed on instruction sheet   Notify your doctor if there is any change in your medical condition (cold, fever, infection).  Wear comfortable clothing (specific to your surgery type) to the hospital.  Plan for stool softeners for home use; pain medications have a tendency to cause constipation. You can also help prevent constipation by eating foods high in fiber such as fruits and vegetables and drinking plenty of fluids as your diet allows.  After surgery, you can help prevent lung complications by doing breathing exercises.  Take deep breaths and cough every 1-2 hours. Your doctor may order a device called an Incentive Spirometer to help you take deep breaths. When coughing or sneezing, hold a pillow firmly against your  incision with both hands. This is called "splinting." Doing this helps protect your incision. It also decreases belly discomfort.  If you are being admitted to the hospital overnight, leave your suitcase in the car. After surgery it may be brought to your room.  If you are being discharged the day of surgery, you will not be allowed to drive home. You will need a responsible adult (18  years or older) to drive you home and stay with you that night.   If you are taking public transportation, you will need to have a responsible adult (18 years or older) with you. Please confirm with your physician that it is acceptable to use public transportation.   Please call the [place] Dept. at [phone] if you have any questions about these instructions.  Visitation Policy:  Patients undergoing a surgery or procedure may have one family member or support person with them as long as that person is not COVID-19 positive or experiencing its symptoms.  That person may remain in the waiting area during the procedure.  Inpatient Visitation Update:   In an effort to ensure the safety of our team members and our patients, we are implementing a change to our visitation policy:  Effective Monday, Aug. 9, at 7 a.m., inpatients will be allowed one support person.  o The support person may change daily.  o The support person must pass our screening, gel in and out, and wear a mask at all times, including in the patient's room.  o Patients must also wear a mask when staff or their support person are in the room.  o Masking is required regardless of vaccination status.  Systemwide, no visitors 17 or younger.

## 2020-04-07 ENCOUNTER — Other Ambulatory Visit
Admission: RE | Admit: 2020-04-07 | Discharge: 2020-04-07 | Disposition: A | Discharge status: Home / Self Care | Payer: PPO | Source: Ambulatory Visit | Attending: Otolaryngology | Admitting: Otolaryngology

## 2020-04-07 ENCOUNTER — Other Ambulatory Visit: Payer: Self-pay

## 2020-04-07 DIAGNOSIS — Z01812 Encounter for preprocedural laboratory examination: Secondary | ICD-10-CM | POA: Diagnosis not present

## 2020-04-07 DIAGNOSIS — Z20822 Contact with and (suspected) exposure to covid-19: Secondary | ICD-10-CM | POA: Insufficient documentation

## 2020-04-08 LAB — SARS CORONAVIRUS 2 (TAT 6-24 HRS): SARS Coronavirus 2: NEGATIVE

## 2020-04-09 ENCOUNTER — Ambulatory Visit
Admission: RE | Admit: 2020-04-09 | Discharge: 2020-04-09 | Disposition: A | Discharge status: Home / Self Care | Payer: PPO | Attending: Otolaryngology | Admitting: Otolaryngology

## 2020-04-09 ENCOUNTER — Other Ambulatory Visit: Payer: Self-pay

## 2020-04-09 ENCOUNTER — Ambulatory Visit: Payer: PPO | Admitting: Anesthesiology

## 2020-04-09 ENCOUNTER — Encounter: Payer: Self-pay | Admitting: Otolaryngology

## 2020-04-09 ENCOUNTER — Encounter
Admission: RE | Disposition: A | Discharge status: Home / Self Care | Payer: Self-pay | Source: Home / Self Care | Attending: Otolaryngology

## 2020-04-09 DIAGNOSIS — Z79899 Other long term (current) drug therapy: Secondary | ICD-10-CM | POA: Diagnosis not present

## 2020-04-09 DIAGNOSIS — N189 Chronic kidney disease, unspecified: Secondary | ICD-10-CM | POA: Diagnosis not present

## 2020-04-09 DIAGNOSIS — I10 Essential (primary) hypertension: Secondary | ICD-10-CM | POA: Diagnosis not present

## 2020-04-09 DIAGNOSIS — D110 Benign neoplasm of parotid gland: Secondary | ICD-10-CM | POA: Insufficient documentation

## 2020-04-09 DIAGNOSIS — I129 Hypertensive chronic kidney disease with stage 1 through stage 4 chronic kidney disease, or unspecified chronic kidney disease: Secondary | ICD-10-CM | POA: Diagnosis not present

## 2020-04-09 DIAGNOSIS — D490 Neoplasm of unspecified behavior of digestive system: Secondary | ICD-10-CM | POA: Diagnosis present

## 2020-04-09 DIAGNOSIS — D37030 Neoplasm of uncertain behavior of the parotid salivary glands: Secondary | ICD-10-CM | POA: Diagnosis not present

## 2020-04-09 HISTORY — PX: PAROTIDECTOMY: SHX2163

## 2020-04-09 SURGERY — EXCISION, PAROTID GLAND
Anesthesia: General | Laterality: Right

## 2020-04-09 MED ORDER — ONDANSETRON HCL 4 MG/2ML IJ SOLN: 4.0000 mg | Freq: Once | INTRAMUSCULAR | Status: DC | PRN

## 2020-04-09 MED ORDER — EPHEDRINE 5 MG/ML INJ
INTRAVENOUS | Status: AC
  Filled 2020-04-09: qty 10

## 2020-04-09 MED ORDER — CHLORHEXIDINE GLUCONATE 0.12 % MT SOLN: 15.0000 mL | Freq: Once | OROMUCOSAL | Status: AC

## 2020-04-09 MED ORDER — REMIFENTANIL HCL 1 MG IV SOLR
INTRAVENOUS | Status: AC
  Filled 2020-04-09: qty 1000

## 2020-04-09 MED ORDER — ACETAMINOPHEN 10 MG/ML IV SOLN
INTRAVENOUS | Status: DC | PRN
  Administered 2020-04-09: 1000 mg via INTRAVENOUS

## 2020-04-09 MED ORDER — PROPOFOL 10 MG/ML IV BOLUS
INTRAVENOUS | Status: DC | PRN
  Administered 2020-04-09: 130 mg via INTRAVENOUS

## 2020-04-09 MED ORDER — BACITRACIN ZINC 500 UNIT/GM EX OINT
TOPICAL_OINTMENT | CUTANEOUS | Status: AC
  Filled 2020-04-09: qty 28.35

## 2020-04-09 MED ORDER — FENTANYL CITRATE (PF) 100 MCG/2ML IJ SOLN
INTRAMUSCULAR | Status: DC | PRN
  Administered 2020-04-09 (×2): 50 ug via INTRAVENOUS

## 2020-04-09 MED ORDER — KETOROLAC TROMETHAMINE 30 MG/ML IJ SOLN
INTRAMUSCULAR | Status: AC
  Administered 2020-04-09: 14:00:00 30 mg via INTRAVENOUS
  Filled 2020-04-09: qty 1

## 2020-04-09 MED ORDER — CHLORHEXIDINE GLUCONATE 0.12 % MT SOLN
OROMUCOSAL | Status: AC
  Administered 2020-04-09: 08:00:00 15 mL via OROMUCOSAL
  Filled 2020-04-09: qty 15

## 2020-04-09 MED ORDER — SUCCINYLCHOLINE CHLORIDE 200 MG/10ML IV SOSY
PREFILLED_SYRINGE | INTRAVENOUS | Status: AC
  Filled 2020-04-09: qty 10

## 2020-04-09 MED ORDER — ONDANSETRON HCL 4 MG/2ML IJ SOLN
4.0000 mg | Freq: Once | INTRAMUSCULAR | Status: AC | PRN
  Administered 2020-04-09: 13:00:00 4 mg via INTRAVENOUS

## 2020-04-09 MED ORDER — ONDANSETRON HCL 4 MG/2ML IJ SOLN
INTRAMUSCULAR | Status: DC | PRN
  Administered 2020-04-09: 4 mg via INTRAVENOUS

## 2020-04-09 MED ORDER — ONDANSETRON HCL 4 MG/2ML IJ SOLN
INTRAMUSCULAR | Status: AC
  Filled 2020-04-09: qty 2

## 2020-04-09 MED ORDER — BACITRACIN-NEOMYCIN-POLYMYXIN OINTMENT TUBE
TOPICAL_OINTMENT | CUTANEOUS | Status: DC | PRN
  Administered 2020-04-09: 1 via TOPICAL

## 2020-04-09 MED ORDER — FENTANYL CITRATE (PF) 100 MCG/2ML IJ SOLN
INTRAMUSCULAR | Status: AC
  Filled 2020-04-09: qty 2

## 2020-04-09 MED ORDER — SODIUM CHLORIDE 0.9 % IV SOLN
INTRAVENOUS | Status: DC | PRN
  Administered 2020-04-09: .06 ug/kg/min via INTRAVENOUS

## 2020-04-09 MED ORDER — LACTATED RINGERS IV SOLN: INTRAVENOUS | Status: DC

## 2020-04-09 MED ORDER — HYDROCODONE-ACETAMINOPHEN 5-325 MG PO TABS
1.0000 | ORAL_TABLET | Freq: Four times a day (QID) | ORAL | 0 refills | Status: DC | PRN
Start: 2020-04-09 — End: 2020-05-12

## 2020-04-09 MED ORDER — FENTANYL CITRATE (PF) 100 MCG/2ML IJ SOLN: 25.0000 ug | INTRAMUSCULAR | Status: DC | PRN

## 2020-04-09 MED ORDER — FAMOTIDINE 20 MG PO TABS
20.0000 mg | ORAL_TABLET | Freq: Once | ORAL | Status: AC
  Administered 2020-04-09: 08:00:00 20 mg via ORAL

## 2020-04-09 MED ORDER — LIDOCAINE-EPINEPHRINE (PF) 1 %-1:200000 IJ SOLN
INTRAMUSCULAR | Status: DC | PRN
  Administered 2020-04-09: 8 mL

## 2020-04-09 MED ORDER — EPHEDRINE SULFATE 50 MG/ML IJ SOLN
INTRAMUSCULAR | Status: DC | PRN
  Administered 2020-04-09 (×2): 10 mg via INTRAVENOUS

## 2020-04-09 MED ORDER — FAMOTIDINE 20 MG PO TABS
ORAL_TABLET | ORAL | Status: AC
  Filled 2020-04-09: qty 1

## 2020-04-09 MED ORDER — LIDOCAINE HCL (CARDIAC) PF 100 MG/5ML IV SOSY
PREFILLED_SYRINGE | INTRAVENOUS | Status: DC | PRN
  Administered 2020-04-09: 50 mg via INTRAVENOUS

## 2020-04-09 MED ORDER — ACETAMINOPHEN 10 MG/ML IV SOLN
INTRAVENOUS | Status: AC
  Filled 2020-04-09: qty 100

## 2020-04-09 MED ORDER — GLYCOPYRROLATE 0.2 MG/ML IJ SOLN
INTRAMUSCULAR | Status: DC | PRN
  Administered 2020-04-09: .2 mg via INTRAVENOUS

## 2020-04-09 MED ORDER — DEXAMETHASONE SODIUM PHOSPHATE 10 MG/ML IJ SOLN
INTRAMUSCULAR | Status: AC
  Filled 2020-04-09: qty 1

## 2020-04-09 MED ORDER — KETOROLAC TROMETHAMINE 30 MG/ML IJ SOLN: 30.0000 mg | Freq: Once | INTRAMUSCULAR | Status: AC

## 2020-04-09 MED ORDER — DEXAMETHASONE SODIUM PHOSPHATE 10 MG/ML IJ SOLN
INTRAMUSCULAR | Status: DC | PRN
  Administered 2020-04-09: 10 mg via INTRAVENOUS

## 2020-04-09 MED ORDER — GLYCOPYRROLATE 0.2 MG/ML IJ SOLN
INTRAMUSCULAR | Status: AC
  Filled 2020-04-09: qty 1

## 2020-04-09 MED ORDER — LIDOCAINE-EPINEPHRINE (PF) 1 %-1:200000 IJ SOLN
INTRAMUSCULAR | Status: AC
  Filled 2020-04-09: qty 30

## 2020-04-09 MED ORDER — ORAL CARE MOUTH RINSE: 15.0000 mL | Freq: Once | OROMUCOSAL | Status: AC

## 2020-04-09 MED ORDER — PROPOFOL 10 MG/ML IV BOLUS
INTRAVENOUS | Status: AC
  Filled 2020-04-09: qty 20

## 2020-04-09 MED ORDER — SUCCINYLCHOLINE CHLORIDE 20 MG/ML IJ SOLN
INTRAMUSCULAR | Status: DC | PRN
  Administered 2020-04-09: 80 mg via INTRAVENOUS

## 2020-04-09 SURGICAL SUPPLY — 44 items
ADH LQ OCL WTPRF AMP STRL LF (MISCELLANEOUS) ×1
ADHESIVE MASTISOL STRL (MISCELLANEOUS) ×3 IMPLANT
BLADE SURG 15 STRL LF DISP TIS (BLADE) ×1 IMPLANT
BLADE SURG 15 STRL SS (BLADE) ×3
BULB RESERV EVAC DRAIN JP 100C (MISCELLANEOUS) ×2 IMPLANT
CORD BIP STRL DISP 12FT (MISCELLANEOUS) ×3 IMPLANT
COTTON BALL STRL MEDIUM (GAUZE/BANDAGES/DRESSINGS) ×2 IMPLANT
COVER WAND RF STERILE (DRAPES) ×3 IMPLANT
DRAIN JP 10F RND SILICONE (MISCELLANEOUS) ×2 IMPLANT
DRAPE MAG INST 16X20 L/F (DRAPES) ×3 IMPLANT
DRAPE SURG 17X11 SM STRL (DRAPES) ×6 IMPLANT
DRSG TEGADERM 2-3/8X2-3/4 SM (GAUZE/BANDAGES/DRESSINGS) ×9 IMPLANT
DRSG TEGADERM 4X4.75 (GAUZE/BANDAGES/DRESSINGS) ×3 IMPLANT
DRSG TELFA 4X3 1S NADH ST (GAUZE/BANDAGES/DRESSINGS) ×3 IMPLANT
ELECT EMG 20MM DUAL (MISCELLANEOUS) ×6
ELECT NEEDLE 20X.3 GREEN (MISCELLANEOUS) ×3
ELECT REM PT RETURN 9FT ADLT (ELECTROSURGICAL) ×3
ELECTRODE EMG 20MM DUAL (MISCELLANEOUS) ×1 IMPLANT
ELECTRODE NDL 20X.3 GREEN (MISCELLANEOUS) ×1 IMPLANT
ELECTRODE NEEDLE 20X.3 GREEN (MISCELLANEOUS) ×1 IMPLANT
ELECTRODE REM PT RTRN 9FT ADLT (ELECTROSURGICAL) ×1 IMPLANT
FORCEPS JEWEL BIP 4-3/4 STR (INSTRUMENTS) ×3 IMPLANT
GAUZE 4X4 16PLY RFD (DISPOSABLE) ×2 IMPLANT
GAUZE SPONGE 4X4 12PLY STRL (GAUZE/BANDAGES/DRESSINGS) ×3 IMPLANT
GLOVE BIO SURGEON STRL SZ7.5 (GLOVE) ×6 IMPLANT
GOWN STRL REUS W/ TWL LRG LVL3 (GOWN DISPOSABLE) ×3 IMPLANT
GOWN STRL REUS W/TWL LRG LVL3 (GOWN DISPOSABLE) ×9
HOOK STAY BLUNT/RETRACTOR 5M (MISCELLANEOUS) ×3 IMPLANT
KIT TURNOVER KIT A (KITS) ×3 IMPLANT
LABEL OR SOLS (LABEL) ×3 IMPLANT
MANIFOLD NEPTUNE II (INSTRUMENTS) ×3 IMPLANT
PACK HEAD/NECK (MISCELLANEOUS) ×3 IMPLANT
PROBE MONO 100X0.75 ELECT 1.9M (MISCELLANEOUS) ×3 IMPLANT
SHEARS HARMONIC 9CM CVD (BLADE) ×3 IMPLANT
SPONGE KITTNER 5P (MISCELLANEOUS) ×6 IMPLANT
SUT ETHILON 3-0 FS-10 30 BLK (SUTURE) ×3
SUT PROLENE 5 0 PS 3 (SUTURE) ×3 IMPLANT
SUT SILK 2 0 (SUTURE) ×3
SUT SILK 2-0 18XBRD TIE 12 (SUTURE) ×2 IMPLANT
SUT SILK 3 0 REEL (SUTURE) ×2 IMPLANT
SUT SILK 4 0 (SUTURE)
SUT SILK 4-0 18XBRD TIE 12 (SUTURE) ×2 IMPLANT
SUT VIC AB 4-0 RB1 18 (SUTURE) ×3 IMPLANT
SUTURE EHLN 3-0 FS-10 30 BLK (SUTURE) IMPLANT

## 2020-04-09 NOTE — Anesthesia Procedure Notes (Signed)
Procedure Name: Intubation Date/Time: 04/09/2020 9:27 AM Performed by: Jonna Clark, CRNA Pre-anesthesia Checklist: Patient identified, Patient being monitored, Timeout performed, Emergency Drugs available and Suction available Patient Re-evaluated:Patient Re-evaluated prior to induction Oxygen Delivery Method: Circle system utilized Preoxygenation: Pre-oxygenation with 100% oxygen Induction Type: IV induction Ventilation: Mask ventilation without difficulty Laryngoscope Size: 3 and McGraph Grade View: Grade III Tube type: Oral Tube size: 7.0 mm Number of attempts: 1 Airway Equipment and Method: Stylet Placement Confirmation: ETT inserted through vocal cords under direct vision,  positive ETCO2 and breath sounds checked- equal and bilateral Secured at: 21 cm Tube secured with: Tape Dental Injury: Teeth and Oropharynx as per pre-operative assessment  Difficulty Due To: Difficulty was unanticipated and Difficult Airway- due to anterior larynx Future Recommendations: Recommend- induction with short-acting agent, and alternative techniques readily available

## 2020-04-09 NOTE — Anesthesia Preprocedure Evaluation (Signed)
Anesthesia Evaluation  Patient identified by MRN, date of birth, ID band Patient awake    Reviewed: Allergy & Precautions, NPO status , Patient's Chart, lab work & pertinent test results, reviewed documented beta blocker date and time   Airway Mallampati: II  TM Distance: <3 FB     Dental  (+) Chipped   Pulmonary neg pulmonary ROS,    Pulmonary exam normal        Cardiovascular hypertension, Pt. on medications Normal cardiovascular exam     Neuro/Psych negative neurological ROS  negative psych ROS   GI/Hepatic Neg liver ROS, Colon polyps   Endo/Other  negative endocrine ROS  Renal/GU Renal disease  negative genitourinary   Musculoskeletal negative musculoskeletal ROS (+)   Abdominal Normal abdominal exam  (+)   Peds negative pediatric ROS (+)  Hematology negative hematology ROS (+)   Anesthesia Other Findings Past Medical History: No date: Allergic rhinitis No date: Cancer ([place])     Comment:  lung ca No date: Chronic kidney disease     Comment:  CKD - Stage 3 No date: Colon polyps No date: Hypertension  Reproductive/Obstetrics                             Anesthesia Physical  Anesthesia Plan  ASA: III  Anesthesia Plan: General   Post-op Pain Management:    Induction: Intravenous  PONV Risk Score and Plan:   Airway Management Planned: Oral ETT  Additional Equipment:   Intra-op Plan:   Post-operative Plan: Extubation in OR  Informed Consent: I have reviewed the patients History and Physical, chart, labs and discussed the procedure including the risks, benefits and alternatives for the proposed anesthesia with the patient or authorized representative who has indicated his/her understanding and acceptance.       Plan Discussed with: CRNA  Anesthesia Plan Comments:         Anesthesia Quick Evaluation

## 2020-04-09 NOTE — Transfer of Care (Signed)
Immediate Anesthesia Transfer of Care Note  Patient: Melissa Harvey  Procedure(s) Performed: PAROTIDECTOMY (Right )  Patient Location: PACU  Anesthesia Type:General  Level of Consciousness: drowsy and patient cooperative  Airway & Oxygen Therapy: Patient Spontanous Breathing and Patient connected to face mask oxygen  Post-op Assessment: Report given to RN and Post -op Vital signs reviewed and stable  Post vital signs: Reviewed and stable  Last Vitals:  Vitals Value Taken Time  BP 181/86 04/09/20 1200  Temp 36.1 C 04/09/20 1200  Pulse 77 04/09/20 1201  Resp 11 04/09/20 1201  SpO2 100 % 04/09/20 1201  Vitals shown include unvalidated device data.  Last Pain:  Vitals:   04/09/20 0810  TempSrc: Oral         Complications: No complications documented.

## 2020-04-09 NOTE — H&P (Signed)
History and physical reviewed and will be scanned in later. No change in medical status reported by the patient or family, appears stable for surgery. All questions regarding the procedure answered, and patient (or family if a child) expressed understanding of the procedure. ? ?Melissa Harvey [name] ?@TODAY@ ?

## 2020-04-09 NOTE — Anesthesia Postprocedure Evaluation (Signed)
Anesthesia Post Note  Patient: Melissa Harvey  Procedure(s) Performed: PAROTIDECTOMY (Right )  Patient location during evaluation: PACU Anesthesia Type: General Level of consciousness: awake and alert and oriented Pain management: pain level controlled Vital Signs Assessment: post-procedure vital signs reviewed and stable Respiratory status: spontaneous breathing Cardiovascular status: blood pressure returned to baseline Anesthetic complications: no   No complications documented.   Last Vitals:  Vitals:   04/09/20 1255 04/09/20 1309  BP:  (!) 176/72  Pulse: 61 (!) 55  Resp: 14 14  Temp:  (!) 36.1 C  SpO2: 96% 97%    Last Pain:  Vitals:   04/09/20 1325  TempSrc:   PainSc: 5                  Jemeka Wagler

## 2020-04-09 NOTE — Discharge Instructions (Signed)
AMBULATORY SURGERY  DISCHARGE INSTRUCTIONS  1) The drugs that you were given will stay in your system until tomorrow so for the next 24 hours you should not: A) Drive an automobile B) Make any legal decisions C) Drink any alcoholic beverage  2) You may resume regular meals tomorrow.  Today it is better to start with liquids and gradually work up to solid foods. You may eat anything you prefer, but it is better to start with liquids, then soup and crackers, and gradually work up to solid foods.  3) Please notify your doctor immediately if you have any unusual bleeding, trouble breathing, redness and pain at the surgery site, drainage, fever, or pain not relieved by medication.  Additional Instructions:  Please contact your physician with any problems or Same Day Surgery at [phone], Monday through Friday 6 am to 4 pm, or [place] [facility] number at [phone].

## 2020-04-09 NOTE — Progress Notes (Signed)
PT tolerated ambulating to restroom, denies any nausea and states headache has improved. Dr. Richardson Landry called and made aware, will place discharge orders for pt to go home

## 2020-04-09 NOTE — Op Note (Signed)
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  After induction of general endotracheal anesthesia, the patient was turned 90 degrees and placed on a shoulder roll. The skin was injected along the proposed incision with 1% lidocaine with epinephrine, 1:200,000. The facial nerve monitor electrodes were placed in the usual fashion at the lower lip and brow on the same side of the procedure. Proper functioning of the nerve monitor was assessed. The area was then prepped and draped in the usual sterile fashion.   A 15 blade was then used to incise the skin from just in front of the right tragus, curving below the earlobe, and curving into the neck along an upper neck crease. . The dissection was carried down to the subcutaneous tissues with the harmonic scalpel and through the platysma muscle, exposing the anterior belly of the sternocleidomastoid muscle. More superiorly the dissection proceeded along the tragal cartilage and down towards the mastoid tip. The dissection proceeded inferiorly to expose the digastric muscle. Next a skin flap was elevated just superficial to the fascia over the parotid gland, widely exposing the gland. Dissection then proceeded deeper, dissecting down to the region of the stylomastoid notch, dividing soft tissues with the Harmonic scalpel. The facial nerve was then identified at this point and confirmed with  the nerve stimulator. Dissection then proceeded along the nerve anteriorly, identifying the pes and then dissecting along the middle and inferior branches of the nerve, dividing parotid tissue above the nerve with the Harmonic scalpel. Intervening parotid tissue between branches was divided with the Harmonic scalpel. As the branches were dissected out it became clear that the tumor mass was located deep to the level of the nerve branches, between the inferior and middle nerves branches. These branches were fully dissected out and released from the adjacent deep parotid tissue. The dissection proceeded deep to the mass, dissecting it away from the masseter muscle. Once the dissection had proceeded anterior to the mass of abnormal parotid, the gland was then divided anterior to the mass, avoiding injury to the dissected nerve branches,  The wound was irrigated with saline and hemostasis obtained. A #10 JP drain was placed through a separate stab incision in the skin posterior and inferior to the incision in the neck, and secured with a 3-0 ethilon suture. The subcutaneous tissues were then closed with 4-0 Vicryl suture in an interrupted fashion. The skin was closed with 5-0 Prolene suture in a running locked stitch. Bacitracin ointment was applied to the wound.   The patient was then returned to the anesthesiologist for awakening, and was taken to the Recovery Room in stable condition.  Disposition:   PACU then discharge home  Plan: To floor for observation with potential discharge home with drain in place.    Riley Nearing 04/09/2020 11:45 AM

## 2020-04-10 ENCOUNTER — Encounter: Payer: Self-pay | Admitting: Otolaryngology

## 2020-04-10 LAB — SURGICAL PATHOLOGY

## 2020-04-16 DIAGNOSIS — M9903 Segmental and somatic dysfunction of lumbar region: Secondary | ICD-10-CM | POA: Diagnosis not present

## 2020-04-16 DIAGNOSIS — M9905 Segmental and somatic dysfunction of pelvic region: Secondary | ICD-10-CM | POA: Diagnosis not present

## 2020-04-16 DIAGNOSIS — M5416 Radiculopathy, lumbar region: Secondary | ICD-10-CM | POA: Diagnosis not present

## 2020-04-16 DIAGNOSIS — M62830 Muscle spasm of back: Secondary | ICD-10-CM | POA: Diagnosis not present

## 2020-04-29 DIAGNOSIS — M9903 Segmental and somatic dysfunction of lumbar region: Secondary | ICD-10-CM | POA: Diagnosis not present

## 2020-04-29 DIAGNOSIS — M9905 Segmental and somatic dysfunction of pelvic region: Secondary | ICD-10-CM | POA: Diagnosis not present

## 2020-04-29 DIAGNOSIS — M62830 Muscle spasm of back: Secondary | ICD-10-CM | POA: Diagnosis not present

## 2020-04-29 DIAGNOSIS — M5416 Radiculopathy, lumbar region: Secondary | ICD-10-CM | POA: Diagnosis not present

## 2020-04-30 DIAGNOSIS — M216X2 Other acquired deformities of left foot: Secondary | ICD-10-CM | POA: Diagnosis not present

## 2020-04-30 DIAGNOSIS — M79671 Pain in right foot: Secondary | ICD-10-CM | POA: Diagnosis not present

## 2020-04-30 DIAGNOSIS — M722 Plantar fascial fibromatosis: Secondary | ICD-10-CM | POA: Diagnosis not present

## 2020-04-30 DIAGNOSIS — M7731 Calcaneal spur, right foot: Secondary | ICD-10-CM | POA: Diagnosis not present

## 2020-04-30 DIAGNOSIS — L600 Ingrowing nail: Secondary | ICD-10-CM | POA: Diagnosis not present

## 2020-04-30 DIAGNOSIS — M216X1 Other acquired deformities of right foot: Secondary | ICD-10-CM | POA: Diagnosis not present

## 2020-05-09 ENCOUNTER — Ambulatory Visit
Admission: RE | Admit: 2020-05-09 | Discharge: 2020-05-09 | Disposition: A | Discharge status: Home / Self Care | Payer: PPO | Source: Ambulatory Visit | Attending: Oncology | Admitting: Oncology

## 2020-05-09 ENCOUNTER — Other Ambulatory Visit: Payer: Self-pay

## 2020-05-09 DIAGNOSIS — C3490 Malignant neoplasm of unspecified part of unspecified bronchus or lung: Secondary | ICD-10-CM | POA: Insufficient documentation

## 2020-05-09 DIAGNOSIS — I700 Atherosclerosis of aorta: Secondary | ICD-10-CM | POA: Diagnosis not present

## 2020-05-12 ENCOUNTER — Other Ambulatory Visit: Payer: Self-pay

## 2020-05-12 ENCOUNTER — Encounter: Payer: Self-pay | Admitting: Radiation Oncology

## 2020-05-12 ENCOUNTER — Ambulatory Visit
Admission: RE | Admit: 2020-05-12 | Discharge: 2020-05-12 | Disposition: A | Discharge status: Home / Self Care | Payer: PPO | Source: Ambulatory Visit | Attending: Radiation Oncology | Admitting: Radiation Oncology

## 2020-05-12 ENCOUNTER — Inpatient Hospital Stay: Payer: PPO | Attending: Oncology | Admitting: Oncology

## 2020-05-12 ENCOUNTER — Telehealth: Payer: Self-pay | Admitting: Oncology

## 2020-05-12 ENCOUNTER — Encounter: Payer: Self-pay | Admitting: Oncology

## 2020-05-12 VITALS — BP 189/82 | HR 66 | Temp 96.8°F | Resp 18 | Ht 65.0 in | Wt 142.0 lb

## 2020-05-12 DIAGNOSIS — R911 Solitary pulmonary nodule: Secondary | ICD-10-CM | POA: Diagnosis not present

## 2020-05-12 DIAGNOSIS — N390 Urinary tract infection, site not specified: Secondary | ICD-10-CM | POA: Insufficient documentation

## 2020-05-12 DIAGNOSIS — Z85118 Personal history of other malignant neoplasm of bronchus and lung: Secondary | ICD-10-CM | POA: Diagnosis not present

## 2020-05-12 DIAGNOSIS — Z86010 Personal history of colonic polyps: Secondary | ICD-10-CM | POA: Diagnosis not present

## 2020-05-12 DIAGNOSIS — E785 Hyperlipidemia, unspecified: Secondary | ICD-10-CM | POA: Insufficient documentation

## 2020-05-12 DIAGNOSIS — J309 Allergic rhinitis, unspecified: Secondary | ICD-10-CM | POA: Insufficient documentation

## 2020-05-12 DIAGNOSIS — M8580 Other specified disorders of bone density and structure, unspecified site: Secondary | ICD-10-CM | POA: Insufficient documentation

## 2020-05-12 DIAGNOSIS — Z923 Personal history of irradiation: Secondary | ICD-10-CM | POA: Diagnosis not present

## 2020-05-12 DIAGNOSIS — Z08 Encounter for follow-up examination after completed treatment for malignant neoplasm: Secondary | ICD-10-CM

## 2020-05-12 DIAGNOSIS — R918 Other nonspecific abnormal finding of lung field: Secondary | ICD-10-CM | POA: Diagnosis not present

## 2020-05-12 DIAGNOSIS — K635 Polyp of colon: Secondary | ICD-10-CM | POA: Insufficient documentation

## 2020-05-12 DIAGNOSIS — Z803 Family history of malignant neoplasm of breast: Secondary | ICD-10-CM | POA: Insufficient documentation

## 2020-05-12 NOTE — Telephone Encounter (Signed)
Spoke with Pt to make her aware of CT scan scheduled on 11/07/20. Pt confirmed time.

## 2020-05-12 NOTE — Progress Notes (Signed)
Radiation Oncology Follow up Note  Name: Melissa Harvey   Date:   05/12/2020 MRN:  [id number] DOB: Dec 31, 1940    This 80 y.o. female presents to the clinic today for 12-month follow-up status post SBRT to the right middle lobe for presumed stage I non-small cell lung cancer.  REFERRING PROVIDER: Adin Hector, MD  HPI: Patient is a 80 year old female now at 2 months having completed SBRT to her right middle lobe for presumed stage I non-small cell lung cancer clinically she is doing well specifically [place] cough hemoptysis chest tightness or dysphagia..  She had a recent CT scan of the chest showing no significant interval change in the pulmonary nodule now measuring 1.3 cm.  She is additional scattered small pulmonary nodules which are stable almost certainly benign  COMPLICATIONS OF TREATMENT: none  FOLLOW UP COMPLIANCE: keeps appointments   PHYSICAL EXAM:  BP (!) (P) 189/82 (BP Location: Left Arm, Patient Position: Sitting)   Pulse (P) 66   Temp (!) (P) 96.8 F (36 C) (Tympanic)   Resp (P) 16   Wt (P) 142 lb (64.4 kg)   BMI (P) 23.63 kg/m  Well-developed well-nourished patient in NAD. HEENT reveals PERLA, EOMI, discs not visualized.  Oral cavity is clear. No oral mucosal lesions are identified. Neck is clear without evidence of cervical or supraclavicular adenopathy. Lungs are clear to A&P. Cardiac examination is essentially unremarkable with regular rate and rhythm without murmur rub or thrill. Abdomen is benign with no organomegaly or masses noted. Motor sensory and DTR levels are equal and symmetric in the upper and lower extremities. Cranial nerves II through XII are grossly intact. Proprioception is intact. No peripheral adenopathy or edema is identified. No motor or sensory levels are noted. Crude visual fields are within normal range.  RADIOLOGY RESULTS: CT scan reviewed compatible with above-stated findings  PLAN: Present time her CT scan findings are stable.  I have asked  to see her back in 3 to 4 months with a follow-up CT scan at that time.  I will leave it to Dr. Janese Banks to order follow-up CT scans for my review.  Otherwise she is clinically stable.  And pleased with her overall progress.  I would like to take this opportunity to thank you for allowing me to participate in the care of your patient.Noreene Filbert, MD

## 2020-05-12 NOTE — Progress Notes (Signed)
Pt here to ct results.

## 2020-05-12 NOTE — Progress Notes (Signed)
Hematology/Oncology Consult note [facility]  [phone] [phone]  Patient Care Team: Adin Hector, MD as PCP - General (Internal Medicine) Noreene Filbert, MD as Radiation Oncologist (Radiation Oncology) Telford Nab, RN as Oncology Nurse Navigator   Name of the patient: Melissa Harvey  [id number]  03-02-1941   Date of visit: 05/12/20  Diagnosis-history of presumed stage I lung cancer s/p SBRT   Chief complaint/ Reason for visit- routine follow-up of lung cancer  Heme/Onc history: Patient is a 80 year old female, never smoker who saw Dr. Lanney Gins for lung nodule which has been followed.  Recently she underwent a head CT scan which showed 10 x 12 mm right parotid nodule with an SUV of 3.7 as well as a right middle lobe nodule 11 x 13 mm with an SUV of 4.3.  No other evidence of distant disease or locoregional adenopathy.  She has not had any tissue diagnosis yet and referred for further management.  Patient decided not to pursue surgical options and received SBRT for presumed lung cancer   Interval history-patient reports doing well and denies any complaints at this time.  She tolerated radiation treatment well  ECOG PS- 1 Pain scale- 0   Review of systems- Review of Systems  Constitutional: Negative for chills, fever, malaise/fatigue and weight loss.  HENT: Negative for congestion, ear discharge and nosebleeds.   Eyes: Negative for blurred vision.  Respiratory: Negative for cough, hemoptysis, sputum production, shortness of breath and wheezing.   Cardiovascular: Negative for chest pain, palpitations, orthopnea and claudication.  Gastrointestinal: Negative for abdominal pain, blood in stool, constipation, diarrhea, heartburn, melena, nausea and vomiting.  Genitourinary: Negative for dysuria, flank pain, frequency, hematuria and urgency.  Musculoskeletal: Negative for back pain, joint pain and myalgias.  Skin: Negative for  rash.  Neurological: Negative for dizziness, tingling, focal weakness, seizures, weakness and headaches.  Endo/Heme/Allergies: Does not bruise/bleed easily.  Psychiatric/Behavioral: Negative for depression and suicidal ideas. The patient does not have insomnia.       No Known Allergies   Past Medical History:  Diagnosis Date  . Allergic rhinitis   . Cancer ([place])    lung ca  . Chronic kidney disease    CKD - Stage 3  . Colon polyps   . Hypertension      Past Surgical History:  Procedure Laterality Date  . COLONOSCOPY WITH PROPOFOL N/A 12/07/2018   Procedure: COLONOSCOPY WITH PROPOFOL;  Surgeon: Lollie Sails, MD;  Location: [facility] ENDOSCOPY;  Service: Endoscopy;  Laterality: N/A;  . PAROTIDECTOMY Right 04/09/2020   Procedure: PAROTIDECTOMY;  Surgeon: Clyde Canterbury, MD;  Location: [facility] ORS;  Service: ENT;  Laterality: Right;  . TONSILLECTOMY      Social History   Socioeconomic History  . Marital status: Married    Spouse name: Not on file  . Number of children: Not on file  . Years of education: Not on file  . Highest education level: Not on file  Occupational History  . Not on file  Tobacco Use  . Smoking status: Never Smoker  . Smokeless tobacco: Never Used  Vaping Use  . Vaping Use: Never used  Substance and Sexual Activity  . Alcohol use: No  . Drug use: No  . Sexual activity: Not on file  Other Topics Concern  . Not on file  Social History Narrative  . Not on file   Social Determinants of Health   Financial Resource Strain: Not on file  Food Insecurity:  Not on file  Transportation Needs: Not on file  Physical Activity: Not on file  Stress: Not on file  Social Connections: Not on file  Intimate Partner Violence: Not on file    Family History  Problem Relation Age of Onset  . Breast cancer Maternal Grandmother 90     Current Outpatient Medications:  .  acetaminophen (TYLENOL) 500 MG tablet, Take 500 mg by mouth every 6 (six) hours as needed  for moderate pain., Disp: , Rfl:  .  amLODipine (NORVASC) 2.5 MG tablet, Take 2.5 mg by mouth every morning. , Disp: , Rfl:  .  Calcium Carbonate-Vitamin D (CALCIUM + D PO), Take 1 each by mouth daily., Disp: , Rfl:  .  Cholecalciferol (VITAMIN D) 50 MCG (2000 UT) tablet, Take 2,000 Units by mouth daily., Disp: , Rfl:  .  CRANBERRY PO, Take 500 mg by mouth daily., Disp: , Rfl:  .  Probiotic Product (PROBIOTIC DAILY PO), Take 1 capsule by mouth daily., Disp: , Rfl:   Physical exam:  Vitals:   05/12/20 1136  BP: (!) 189/82  Pulse: 66  Resp: 18  Temp: (!) 96.8 F (36 C)  TempSrc: Oral  Weight: 142 lb (64.4 kg)  Height: 5\' 5"  (1.651 m)   Physical Exam Constitutional:      General: She is not in acute distress. Eyes:     Extraocular Movements: EOM normal.  Cardiovascular:     Rate and Rhythm: Normal rate and regular rhythm.     Heart sounds: Normal heart sounds.  Pulmonary:     Effort: Pulmonary effort is normal.     Breath sounds: Normal breath sounds.  Skin:    General: Skin is warm and dry.  Neurological:     Mental Status: She is alert and oriented to person, place, and time.      No flowsheet data found. CBC Latest Ref Rng & Units 09/26/2019  WBC 4.0 - 10.5 K/uL 6.7  Hemoglobin 12.0 - 15.0 g/dL 14.2  Hematocrit 36.0 - 46.0 % 41.7  Platelets 150 - 400 K/uL 196    No images are attached to the encounter.  CT Chest Wo Contrast  Result Date: 05/09/2020 CLINICAL DATA:  Follow-up lung cancer, post radiation completed 02/2020, assess treatment response EXAM: CT CHEST WITHOUT CONTRAST TECHNIQUE: Multidetector CT imaging of the chest was performed following the standard protocol without IV contrast. COMPARISON:  PET-CT, 01/08/2020 FINDINGS: Cardiovascular: Aortic atherosclerosis. Normal heart size. No pericardial effusion. Mediastinum/Nodes: No enlarged mediastinal, hilar, or axillary lymph nodes. Thyroid gland, trachea, and esophagus demonstrate no significant findings.  Lungs/Pleura: No significant interval change in a pulmonary nodule of the medial segment right middle lobe measuring 1.3 x 1.0 cm, previously FDG PET avid (series 3, image 92). Additional scattered small pulmonary nodules and ground-glass opacities are stable, almost certainly benign and incidental sequelae of prior infection or inflammation, measuring no greater than 2-3 mm, for example in the left lung base (series 3, image 111). No pleural effusion or pneumothorax. Upper Abdomen: No acute abnormality. Musculoskeletal: No chest wall mass or suspicious bone lesions identified. IMPRESSION: 1. No significant interval change in a pulmonary nodule of the medial segment right middle lobe measuring 1.3 cm, previously FDG avid and consistent with primary lung malignancy. 2. Additional scattered small pulmonary nodules and ground-glass opacities are stable, almost certainly benign and incidental sequelae of prior infection or inflammation, measuring no greater than 2-3 mm. Attention on follow-up. Aortic Atherosclerosis (ICD10-I70.0). Electronically Signed   By: Cristie Hem  Laqueta Carina M.D.   On: 05/09/2020 10:33     Assessment and plan- Patient is a 80 y.o. female with history of right middle lobe lung nodule that was presumed stage I Lung cancer s/p SBRT here for routine follow-up  Recent CT chest without contrast showed stable appearance of the right middle lobe lesion that was 1.3 x 1 cm as compared to her previous PET scan in September 2021.  Discussed with the patient that despite SBRT lung nodules can sometimes persist on CT scans which could be treated lung cancer and not necessarily active lung cancer.  She will need surveillance scans every 6 months.  She can continue to follow-up with radiation oncology at this time and can be referred to [place] in the future if questions or concerns arise.  I have communicated this to Dr. Donella Stade as well and I will schedule her next CT scan in 6 months   Visit Diagnosis 1. Encounter  for follow-up surveillance of lung cancer      Dr. Randa Evens, MD, MPH [facility] [phone] 05/12/2020 4:31 PM

## 2020-05-13 DIAGNOSIS — M62830 Muscle spasm of back: Secondary | ICD-10-CM | POA: Diagnosis not present

## 2020-05-13 DIAGNOSIS — M5416 Radiculopathy, lumbar region: Secondary | ICD-10-CM | POA: Diagnosis not present

## 2020-05-13 DIAGNOSIS — M9905 Segmental and somatic dysfunction of pelvic region: Secondary | ICD-10-CM | POA: Diagnosis not present

## 2020-05-13 DIAGNOSIS — M9903 Segmental and somatic dysfunction of lumbar region: Secondary | ICD-10-CM | POA: Diagnosis not present

## 2020-05-14 ENCOUNTER — Telehealth: Payer: Self-pay | Admitting: Radiation Oncology

## 2020-05-14 NOTE — Telephone Encounter (Signed)
Patient called to reschedule f/u appointment on 7/27 with Dr. Baruch Gouty because her CT scan is scheduled 7/29 and she believes he wants the result when he sees her.

## 2020-05-15 DIAGNOSIS — D2272 Melanocytic nevi of left lower limb, including hip: Secondary | ICD-10-CM | POA: Diagnosis not present

## 2020-05-15 DIAGNOSIS — D2271 Melanocytic nevi of right lower limb, including hip: Secondary | ICD-10-CM | POA: Diagnosis not present

## 2020-05-15 DIAGNOSIS — D2262 Melanocytic nevi of left upper limb, including shoulder: Secondary | ICD-10-CM | POA: Diagnosis not present

## 2020-05-15 DIAGNOSIS — D2261 Melanocytic nevi of right upper limb, including shoulder: Secondary | ICD-10-CM | POA: Diagnosis not present

## 2020-05-15 DIAGNOSIS — D225 Melanocytic nevi of trunk: Secondary | ICD-10-CM | POA: Diagnosis not present

## 2020-05-21 DIAGNOSIS — M216X2 Other acquired deformities of left foot: Secondary | ICD-10-CM | POA: Diagnosis not present

## 2020-05-21 DIAGNOSIS — M7731 Calcaneal spur, right foot: Secondary | ICD-10-CM | POA: Diagnosis not present

## 2020-05-21 DIAGNOSIS — L603 Nail dystrophy: Secondary | ICD-10-CM | POA: Diagnosis not present

## 2020-05-21 DIAGNOSIS — M79671 Pain in right foot: Secondary | ICD-10-CM | POA: Diagnosis not present

## 2020-05-21 DIAGNOSIS — L600 Ingrowing nail: Secondary | ICD-10-CM | POA: Diagnosis not present

## 2020-05-21 DIAGNOSIS — M216X1 Other acquired deformities of right foot: Secondary | ICD-10-CM | POA: Diagnosis not present

## 2020-05-21 DIAGNOSIS — M722 Plantar fascial fibromatosis: Secondary | ICD-10-CM | POA: Diagnosis not present

## 2020-06-10 DIAGNOSIS — M9903 Segmental and somatic dysfunction of lumbar region: Secondary | ICD-10-CM | POA: Diagnosis not present

## 2020-06-10 DIAGNOSIS — M5416 Radiculopathy, lumbar region: Secondary | ICD-10-CM | POA: Diagnosis not present

## 2020-06-10 DIAGNOSIS — M9905 Segmental and somatic dysfunction of pelvic region: Secondary | ICD-10-CM | POA: Diagnosis not present

## 2020-06-10 DIAGNOSIS — M62830 Muscle spasm of back: Secondary | ICD-10-CM | POA: Diagnosis not present

## 2020-07-08 DIAGNOSIS — M62830 Muscle spasm of back: Secondary | ICD-10-CM | POA: Diagnosis not present

## 2020-07-08 DIAGNOSIS — M9903 Segmental and somatic dysfunction of lumbar region: Secondary | ICD-10-CM | POA: Diagnosis not present

## 2020-07-08 DIAGNOSIS — M5416 Radiculopathy, lumbar region: Secondary | ICD-10-CM | POA: Diagnosis not present

## 2020-07-08 DIAGNOSIS — M9905 Segmental and somatic dysfunction of pelvic region: Secondary | ICD-10-CM | POA: Diagnosis not present

## 2020-07-14 DIAGNOSIS — N1831 Chronic kidney disease, stage 3a: Secondary | ICD-10-CM | POA: Diagnosis not present

## 2020-07-14 DIAGNOSIS — E785 Hyperlipidemia, unspecified: Secondary | ICD-10-CM | POA: Diagnosis not present

## 2020-07-16 DIAGNOSIS — H52223 Regular astigmatism, bilateral: Secondary | ICD-10-CM | POA: Diagnosis not present

## 2020-07-16 DIAGNOSIS — H5203 Hypermetropia, bilateral: Secondary | ICD-10-CM | POA: Diagnosis not present

## 2020-07-16 DIAGNOSIS — H16223 Keratoconjunctivitis sicca, not specified as Sjogren's, bilateral: Secondary | ICD-10-CM | POA: Diagnosis not present

## 2020-07-16 DIAGNOSIS — H524 Presbyopia: Secondary | ICD-10-CM | POA: Diagnosis not present

## 2020-07-21 DIAGNOSIS — C342 Malignant neoplasm of middle lobe, bronchus or lung: Secondary | ICD-10-CM | POA: Diagnosis not present

## 2020-07-21 DIAGNOSIS — E785 Hyperlipidemia, unspecified: Secondary | ICD-10-CM | POA: Diagnosis not present

## 2020-07-21 DIAGNOSIS — N1831 Chronic kidney disease, stage 3a: Secondary | ICD-10-CM | POA: Diagnosis not present

## 2020-07-21 DIAGNOSIS — I10 Essential (primary) hypertension: Secondary | ICD-10-CM | POA: Diagnosis not present

## 2020-07-29 DIAGNOSIS — M9905 Segmental and somatic dysfunction of pelvic region: Secondary | ICD-10-CM | POA: Diagnosis not present

## 2020-07-29 DIAGNOSIS — M5416 Radiculopathy, lumbar region: Secondary | ICD-10-CM | POA: Diagnosis not present

## 2020-07-29 DIAGNOSIS — M9903 Segmental and somatic dysfunction of lumbar region: Secondary | ICD-10-CM | POA: Diagnosis not present

## 2020-07-29 DIAGNOSIS — M62830 Muscle spasm of back: Secondary | ICD-10-CM | POA: Diagnosis not present

## 2020-08-05 DIAGNOSIS — M5416 Radiculopathy, lumbar region: Secondary | ICD-10-CM | POA: Diagnosis not present

## 2020-08-05 DIAGNOSIS — M62830 Muscle spasm of back: Secondary | ICD-10-CM | POA: Diagnosis not present

## 2020-08-05 DIAGNOSIS — M9903 Segmental and somatic dysfunction of lumbar region: Secondary | ICD-10-CM | POA: Diagnosis not present

## 2020-08-05 DIAGNOSIS — M9905 Segmental and somatic dysfunction of pelvic region: Secondary | ICD-10-CM | POA: Diagnosis not present

## 2020-09-03 DIAGNOSIS — H16223 Keratoconjunctivitis sicca, not specified as Sjogren's, bilateral: Secondary | ICD-10-CM | POA: Diagnosis not present

## 2020-09-16 DIAGNOSIS — M9903 Segmental and somatic dysfunction of lumbar region: Secondary | ICD-10-CM | POA: Diagnosis not present

## 2020-09-16 DIAGNOSIS — M62830 Muscle spasm of back: Secondary | ICD-10-CM | POA: Diagnosis not present

## 2020-09-16 DIAGNOSIS — M5416 Radiculopathy, lumbar region: Secondary | ICD-10-CM | POA: Diagnosis not present

## 2020-09-16 DIAGNOSIS — M9905 Segmental and somatic dysfunction of pelvic region: Secondary | ICD-10-CM | POA: Diagnosis not present

## 2020-09-23 DIAGNOSIS — H2511 Age-related nuclear cataract, right eye: Secondary | ICD-10-CM | POA: Diagnosis not present

## 2020-09-23 DIAGNOSIS — H2513 Age-related nuclear cataract, bilateral: Secondary | ICD-10-CM | POA: Diagnosis not present

## 2020-09-23 DIAGNOSIS — H25043 Posterior subcapsular polar age-related cataract, bilateral: Secondary | ICD-10-CM | POA: Diagnosis not present

## 2020-09-23 DIAGNOSIS — H25013 Cortical age-related cataract, bilateral: Secondary | ICD-10-CM | POA: Diagnosis not present

## 2020-09-23 DIAGNOSIS — H18413 Arcus senilis, bilateral: Secondary | ICD-10-CM | POA: Diagnosis not present

## 2020-09-30 DIAGNOSIS — M9903 Segmental and somatic dysfunction of lumbar region: Secondary | ICD-10-CM | POA: Diagnosis not present

## 2020-09-30 DIAGNOSIS — M9905 Segmental and somatic dysfunction of pelvic region: Secondary | ICD-10-CM | POA: Diagnosis not present

## 2020-09-30 DIAGNOSIS — M62830 Muscle spasm of back: Secondary | ICD-10-CM | POA: Diagnosis not present

## 2020-09-30 DIAGNOSIS — M5416 Radiculopathy, lumbar region: Secondary | ICD-10-CM | POA: Diagnosis not present

## 2020-10-15 DIAGNOSIS — M5416 Radiculopathy, lumbar region: Secondary | ICD-10-CM | POA: Diagnosis not present

## 2020-10-15 DIAGNOSIS — M9903 Segmental and somatic dysfunction of lumbar region: Secondary | ICD-10-CM | POA: Diagnosis not present

## 2020-10-15 DIAGNOSIS — M62830 Muscle spasm of back: Secondary | ICD-10-CM | POA: Diagnosis not present

## 2020-10-15 DIAGNOSIS — M9905 Segmental and somatic dysfunction of pelvic region: Secondary | ICD-10-CM | POA: Diagnosis not present

## 2020-10-27 DIAGNOSIS — H6983 Other specified disorders of Eustachian tube, bilateral: Secondary | ICD-10-CM | POA: Diagnosis not present

## 2020-10-27 DIAGNOSIS — H6121 Impacted cerumen, right ear: Secondary | ICD-10-CM | POA: Diagnosis not present

## 2020-10-28 DIAGNOSIS — M9903 Segmental and somatic dysfunction of lumbar region: Secondary | ICD-10-CM | POA: Diagnosis not present

## 2020-10-28 DIAGNOSIS — M5416 Radiculopathy, lumbar region: Secondary | ICD-10-CM | POA: Diagnosis not present

## 2020-10-28 DIAGNOSIS — M62830 Muscle spasm of back: Secondary | ICD-10-CM | POA: Diagnosis not present

## 2020-10-28 DIAGNOSIS — M9905 Segmental and somatic dysfunction of pelvic region: Secondary | ICD-10-CM | POA: Diagnosis not present

## 2020-11-05 ENCOUNTER — Ambulatory Visit: Payer: PPO | Admitting: Radiation Oncology

## 2020-11-07 ENCOUNTER — Other Ambulatory Visit: Payer: Self-pay

## 2020-11-07 ENCOUNTER — Ambulatory Visit
Admission: RE | Admit: 2020-11-07 | Discharge: 2020-11-07 | Disposition: A | Discharge status: Home / Self Care | Payer: PPO | Source: Ambulatory Visit | Attending: Oncology | Admitting: Oncology

## 2020-11-07 DIAGNOSIS — Z08 Encounter for follow-up examination after completed treatment for malignant neoplasm: Secondary | ICD-10-CM | POA: Diagnosis not present

## 2020-11-07 DIAGNOSIS — R911 Solitary pulmonary nodule: Secondary | ICD-10-CM | POA: Diagnosis not present

## 2020-11-07 DIAGNOSIS — J9811 Atelectasis: Secondary | ICD-10-CM | POA: Diagnosis not present

## 2020-11-07 DIAGNOSIS — Z85118 Personal history of other malignant neoplasm of bronchus and lung: Secondary | ICD-10-CM | POA: Insufficient documentation

## 2020-11-07 DIAGNOSIS — I700 Atherosclerosis of aorta: Secondary | ICD-10-CM | POA: Diagnosis not present

## 2020-11-07 DIAGNOSIS — R918 Other nonspecific abnormal finding of lung field: Secondary | ICD-10-CM | POA: Diagnosis not present

## 2020-11-11 DIAGNOSIS — M5416 Radiculopathy, lumbar region: Secondary | ICD-10-CM | POA: Diagnosis not present

## 2020-11-11 DIAGNOSIS — M9905 Segmental and somatic dysfunction of pelvic region: Secondary | ICD-10-CM | POA: Diagnosis not present

## 2020-11-11 DIAGNOSIS — M9903 Segmental and somatic dysfunction of lumbar region: Secondary | ICD-10-CM | POA: Diagnosis not present

## 2020-11-11 DIAGNOSIS — M62830 Muscle spasm of back: Secondary | ICD-10-CM | POA: Diagnosis not present

## 2020-11-24 ENCOUNTER — Ambulatory Visit: Payer: PPO | Admitting: Radiation Oncology

## 2020-11-24 ENCOUNTER — Other Ambulatory Visit: Payer: Self-pay | Admitting: *Deleted

## 2020-11-24 ENCOUNTER — Ambulatory Visit
Admission: RE | Admit: 2020-11-24 | Discharge: 2020-11-24 | Disposition: A | Discharge status: Home / Self Care | Payer: PPO | Source: Ambulatory Visit | Attending: Radiation Oncology | Admitting: Radiation Oncology

## 2020-11-24 ENCOUNTER — Other Ambulatory Visit: Payer: Self-pay

## 2020-11-24 ENCOUNTER — Encounter: Payer: Self-pay | Admitting: Radiation Oncology

## 2020-11-24 DIAGNOSIS — R911 Solitary pulmonary nodule: Secondary | ICD-10-CM

## 2020-11-24 DIAGNOSIS — Z08 Encounter for follow-up examination after completed treatment for malignant neoplasm: Secondary | ICD-10-CM | POA: Diagnosis not present

## 2020-11-24 NOTE — Progress Notes (Signed)
Radiation Oncology Follow up Note  Name: Melissa Harvey   Date:   11/24/2020 MRN:  [id number] DOB: 09/03/1940  Radiation Oncology TeleHEALTH VISIT PROGRESS NOTE  I connected with Melissa Harvey by telephone-Webex and verified that I am speaking with the correct person using two identifiers.  I discussed the limitations, risks, security and privacy concerns of performing an evaluation and management service by telemedicine and the availability of in-person appointments. I also discussed with the patient that there may be a patient responsible charge related to this service. The patient expressed understanding and agreed to proceed.    Other persons participating in the visit and their role in the encounter:    Patient's location: Home   Provider's location: work  This 80 y.o. female presents by phone for 51-month follow-up status post SBRT to her right middle lobe for presumed stage I non-small cell lung cancer  REFERRING PROVIDER: Adin Hector, MD  HPI: Patient is a 80 year old female now about 7 months having completed SBRT to her right middle lobe for presumed stage I non-small cell lung cancer.  Reestablish contact she is doing well specifically Nuys cough hemoptysis or chest tightness..  She had a recent CT scan showing radiation changes in the infrahilar region including some atelectasis of the right middle lobe as well as band volume loss in the right lower lobe.  There are some very small other pulmonary nodules measuring 4 mm and below in diameter which we will follow.  COMPLICATIONS OF TREATMENT: none  FOLLOW UP COMPLIANCE: keeps appointments   PHYSICAL EXAM:  There were no vitals taken for this visit. No physical exam was performed since this was a phone visit  RADIOLOGY RESULTS: CT scans reviewed compatible with above-stated findings  PLAN: Present time patient clinically is doing well.  I reviewed her CT scan showing excellent response to SBRT.  She does have some areas of  radiation changes which we expect at this time.  I have asked to see her back in 6 months with a repeat CT scan of her chest prior to that visit.  Patient knows to call with any concerns.  I would like to take this opportunity to thank you for allowing me to participate in the care of your patient.Noreene Filbert, MD

## 2020-11-25 DIAGNOSIS — M9905 Segmental and somatic dysfunction of pelvic region: Secondary | ICD-10-CM | POA: Diagnosis not present

## 2020-11-25 DIAGNOSIS — M5416 Radiculopathy, lumbar region: Secondary | ICD-10-CM | POA: Diagnosis not present

## 2020-11-25 DIAGNOSIS — M9903 Segmental and somatic dysfunction of lumbar region: Secondary | ICD-10-CM | POA: Diagnosis not present

## 2020-11-25 DIAGNOSIS — M62830 Muscle spasm of back: Secondary | ICD-10-CM | POA: Diagnosis not present

## 2020-12-10 ENCOUNTER — Ambulatory Visit: Payer: PPO | Admitting: Radiation Oncology

## 2020-12-10 DIAGNOSIS — M9903 Segmental and somatic dysfunction of lumbar region: Secondary | ICD-10-CM | POA: Diagnosis not present

## 2020-12-10 DIAGNOSIS — M9905 Segmental and somatic dysfunction of pelvic region: Secondary | ICD-10-CM | POA: Diagnosis not present

## 2020-12-10 DIAGNOSIS — M5416 Radiculopathy, lumbar region: Secondary | ICD-10-CM | POA: Diagnosis not present

## 2020-12-10 DIAGNOSIS — M62830 Muscle spasm of back: Secondary | ICD-10-CM | POA: Diagnosis not present

## 2020-12-22 DIAGNOSIS — H52201 Unspecified astigmatism, right eye: Secondary | ICD-10-CM | POA: Diagnosis not present

## 2020-12-22 DIAGNOSIS — H2511 Age-related nuclear cataract, right eye: Secondary | ICD-10-CM | POA: Diagnosis not present

## 2020-12-23 DIAGNOSIS — H2512 Age-related nuclear cataract, left eye: Secondary | ICD-10-CM | POA: Diagnosis not present

## 2020-12-31 DIAGNOSIS — M9905 Segmental and somatic dysfunction of pelvic region: Secondary | ICD-10-CM | POA: Diagnosis not present

## 2020-12-31 DIAGNOSIS — M5416 Radiculopathy, lumbar region: Secondary | ICD-10-CM | POA: Diagnosis not present

## 2020-12-31 DIAGNOSIS — M62830 Muscle spasm of back: Secondary | ICD-10-CM | POA: Diagnosis not present

## 2020-12-31 DIAGNOSIS — M9903 Segmental and somatic dysfunction of lumbar region: Secondary | ICD-10-CM | POA: Diagnosis not present

## 2021-01-05 DIAGNOSIS — H2512 Age-related nuclear cataract, left eye: Secondary | ICD-10-CM | POA: Diagnosis not present

## 2021-01-05 DIAGNOSIS — H52202 Unspecified astigmatism, left eye: Secondary | ICD-10-CM | POA: Diagnosis not present

## 2021-01-07 ENCOUNTER — Other Ambulatory Visit: Payer: Self-pay | Admitting: Internal Medicine

## 2021-01-07 DIAGNOSIS — Z1231 Encounter for screening mammogram for malignant neoplasm of breast: Secondary | ICD-10-CM

## 2021-01-21 DIAGNOSIS — E785 Hyperlipidemia, unspecified: Secondary | ICD-10-CM | POA: Diagnosis not present

## 2021-01-21 DIAGNOSIS — N1831 Chronic kidney disease, stage 3a: Secondary | ICD-10-CM | POA: Diagnosis not present

## 2021-01-21 DIAGNOSIS — I10 Essential (primary) hypertension: Secondary | ICD-10-CM | POA: Diagnosis not present

## 2021-01-26 DIAGNOSIS — H2512 Age-related nuclear cataract, left eye: Secondary | ICD-10-CM | POA: Diagnosis not present

## 2021-01-27 DIAGNOSIS — M9903 Segmental and somatic dysfunction of lumbar region: Secondary | ICD-10-CM | POA: Diagnosis not present

## 2021-01-27 DIAGNOSIS — M9905 Segmental and somatic dysfunction of pelvic region: Secondary | ICD-10-CM | POA: Diagnosis not present

## 2021-01-27 DIAGNOSIS — M5416 Radiculopathy, lumbar region: Secondary | ICD-10-CM | POA: Diagnosis not present

## 2021-01-27 DIAGNOSIS — M62830 Muscle spasm of back: Secondary | ICD-10-CM | POA: Diagnosis not present

## 2021-01-28 DIAGNOSIS — Z780 Asymptomatic menopausal state: Secondary | ICD-10-CM | POA: Diagnosis not present

## 2021-01-28 DIAGNOSIS — E785 Hyperlipidemia, unspecified: Secondary | ICD-10-CM | POA: Diagnosis not present

## 2021-01-28 DIAGNOSIS — Z0000 Encounter for general adult medical examination without abnormal findings: Secondary | ICD-10-CM | POA: Diagnosis not present

## 2021-01-28 DIAGNOSIS — N1831 Chronic kidney disease, stage 3a: Secondary | ICD-10-CM | POA: Diagnosis not present

## 2021-01-28 DIAGNOSIS — I10 Essential (primary) hypertension: Secondary | ICD-10-CM | POA: Diagnosis not present

## 2021-02-09 ENCOUNTER — Other Ambulatory Visit: Payer: Self-pay

## 2021-02-09 ENCOUNTER — Ambulatory Visit
Admission: RE | Admit: 2021-02-09 | Discharge: 2021-02-09 | Disposition: A | Discharge status: Home / Self Care | Payer: PPO | Source: Ambulatory Visit | Attending: Internal Medicine | Admitting: Internal Medicine

## 2021-02-09 DIAGNOSIS — Z1231 Encounter for screening mammogram for malignant neoplasm of breast: Secondary | ICD-10-CM | POA: Insufficient documentation

## 2021-02-25 DIAGNOSIS — M62830 Muscle spasm of back: Secondary | ICD-10-CM | POA: Diagnosis not present

## 2021-02-25 DIAGNOSIS — M5416 Radiculopathy, lumbar region: Secondary | ICD-10-CM | POA: Diagnosis not present

## 2021-02-25 DIAGNOSIS — M9903 Segmental and somatic dysfunction of lumbar region: Secondary | ICD-10-CM | POA: Diagnosis not present

## 2021-02-25 DIAGNOSIS — M9905 Segmental and somatic dysfunction of pelvic region: Secondary | ICD-10-CM | POA: Diagnosis not present

## 2021-03-18 DIAGNOSIS — R300 Dysuria: Secondary | ICD-10-CM | POA: Diagnosis not present

## 2021-03-24 DIAGNOSIS — M5416 Radiculopathy, lumbar region: Secondary | ICD-10-CM | POA: Diagnosis not present

## 2021-03-24 DIAGNOSIS — M9905 Segmental and somatic dysfunction of pelvic region: Secondary | ICD-10-CM | POA: Diagnosis not present

## 2021-03-24 DIAGNOSIS — M62830 Muscle spasm of back: Secondary | ICD-10-CM | POA: Diagnosis not present

## 2021-03-24 DIAGNOSIS — M9903 Segmental and somatic dysfunction of lumbar region: Secondary | ICD-10-CM | POA: Diagnosis not present

## 2021-03-31 DIAGNOSIS — M5416 Radiculopathy, lumbar region: Secondary | ICD-10-CM | POA: Diagnosis not present

## 2021-03-31 DIAGNOSIS — M9903 Segmental and somatic dysfunction of lumbar region: Secondary | ICD-10-CM | POA: Diagnosis not present

## 2021-03-31 DIAGNOSIS — M62830 Muscle spasm of back: Secondary | ICD-10-CM | POA: Diagnosis not present

## 2021-03-31 DIAGNOSIS — M9905 Segmental and somatic dysfunction of pelvic region: Secondary | ICD-10-CM | POA: Diagnosis not present

## 2021-04-21 DIAGNOSIS — M5416 Radiculopathy, lumbar region: Secondary | ICD-10-CM | POA: Diagnosis not present

## 2021-04-21 DIAGNOSIS — M9905 Segmental and somatic dysfunction of pelvic region: Secondary | ICD-10-CM | POA: Diagnosis not present

## 2021-04-21 DIAGNOSIS — M62830 Muscle spasm of back: Secondary | ICD-10-CM | POA: Diagnosis not present

## 2021-04-21 DIAGNOSIS — M9903 Segmental and somatic dysfunction of lumbar region: Secondary | ICD-10-CM | POA: Diagnosis not present

## 2021-05-18 DIAGNOSIS — M8588 Other specified disorders of bone density and structure, other site: Secondary | ICD-10-CM | POA: Diagnosis not present

## 2021-05-19 DIAGNOSIS — M9905 Segmental and somatic dysfunction of pelvic region: Secondary | ICD-10-CM | POA: Diagnosis not present

## 2021-05-19 DIAGNOSIS — M9903 Segmental and somatic dysfunction of lumbar region: Secondary | ICD-10-CM | POA: Diagnosis not present

## 2021-05-19 DIAGNOSIS — M5416 Radiculopathy, lumbar region: Secondary | ICD-10-CM | POA: Diagnosis not present

## 2021-05-19 DIAGNOSIS — M62830 Muscle spasm of back: Secondary | ICD-10-CM | POA: Diagnosis not present

## 2021-05-20 ENCOUNTER — Other Ambulatory Visit: Payer: Self-pay

## 2021-05-20 ENCOUNTER — Ambulatory Visit
Admission: RE | Admit: 2021-05-20 | Discharge: 2021-05-20 | Disposition: A | Discharge status: Home / Self Care | Payer: PPO | Source: Ambulatory Visit | Attending: Radiation Oncology | Admitting: Radiation Oncology

## 2021-05-20 DIAGNOSIS — R911 Solitary pulmonary nodule: Secondary | ICD-10-CM | POA: Diagnosis not present

## 2021-05-20 DIAGNOSIS — I700 Atherosclerosis of aorta: Secondary | ICD-10-CM | POA: Diagnosis not present

## 2021-05-20 LAB — POCT I-STAT CREATININE: Creatinine, Ser: 1.3 mg/dL — ABNORMAL HIGH (ref 0.44–1.00)

## 2021-05-20 MED ORDER — IOHEXOL 300 MG/ML  SOLN
75.0000 mL | Freq: Once | INTRAMUSCULAR | Status: AC | PRN
  Administered 2021-05-20: 60 mL via INTRAVENOUS

## 2021-05-27 ENCOUNTER — Ambulatory Visit: Payer: PPO | Admitting: Radiation Oncology

## 2021-05-28 ENCOUNTER — Other Ambulatory Visit: Payer: Self-pay

## 2021-05-28 ENCOUNTER — Ambulatory Visit
Admission: RE | Admit: 2021-05-28 | Discharge: 2021-05-28 | Disposition: A | Discharge status: Home / Self Care | Payer: PPO | Source: Ambulatory Visit | Attending: Radiation Oncology | Admitting: Radiation Oncology

## 2021-05-28 ENCOUNTER — Encounter: Payer: Self-pay | Admitting: Radiation Oncology

## 2021-05-28 ENCOUNTER — Other Ambulatory Visit: Payer: Self-pay | Admitting: *Deleted

## 2021-05-28 VITALS — BP 175/80 | HR 62 | Temp 96.0°F | Resp 16 | Wt 138.7 lb

## 2021-05-28 DIAGNOSIS — I700 Atherosclerosis of aorta: Secondary | ICD-10-CM | POA: Insufficient documentation

## 2021-05-28 DIAGNOSIS — R911 Solitary pulmonary nodule: Secondary | ICD-10-CM

## 2021-05-28 DIAGNOSIS — C3490 Malignant neoplasm of unspecified part of unspecified bronchus or lung: Secondary | ICD-10-CM

## 2021-05-28 DIAGNOSIS — I2510 Atherosclerotic heart disease of native coronary artery without angina pectoris: Secondary | ICD-10-CM | POA: Diagnosis not present

## 2021-05-28 DIAGNOSIS — Z08 Encounter for follow-up examination after completed treatment for malignant neoplasm: Secondary | ICD-10-CM | POA: Diagnosis not present

## 2021-05-28 NOTE — Progress Notes (Signed)
Radiation Oncology Follow up Note  Name: Melissa Harvey   Date:   05/28/2021 MRN:  [id number] DOB: 15-Nov-1940    This 81 y.o. female presents to the clinic today for 1 year follow-up status post SBRT to her right middle lobe for presumed stage I non-small cell lung cancer.  REFERRING PROVIDER: Adin Hector, MD  HPI: Patient is a 81 year old female now at 1 year having completed SBRT to her right middle lobe for presumed stage I non-small cell lung cancer.  Seen today in routine follow-up she is doing well.  She specifically Nuys cough hemoptysis or chest tightness or any change in her pulmonary status..  She had a recent CT scan showing stable appearance of postradiation fibrosis in the right lung no findings suggestive with local recurrent or progressive disease.  She was noted to have aortic atherosclerosis with right coronary artery disease and has been referred to cardiology for evaluation of that.  COMPLICATIONS OF TREATMENT: none  FOLLOW UP COMPLIANCE: keeps appointments   PHYSICAL EXAM:  BP (!) 175/80 (BP Location: Left Arm, Patient Position: Sitting)    Pulse 62    Temp (!) 96 F (35.6 C) (Tympanic)    Resp 16    Wt 138 lb 11.2 oz (62.9 kg)    BMI 23.08 kg/m  Well-developed well-nourished patient in NAD. HEENT reveals PERLA, EOMI, discs not visualized.  Oral cavity is clear. No oral mucosal lesions are identified. Neck is clear without evidence of cervical or supraclavicular adenopathy. Lungs are clear to A&P. Cardiac examination is essentially unremarkable with regular rate and rhythm without murmur rub or thrill. Abdomen is benign with no organomegaly or masses noted. Motor sensory and DTR levels are equal and symmetric in the upper and lower extremities. Cranial nerves II through XII are grossly intact. Proprioception is intact. No peripheral adenopathy or edema is identified. No motor or sensory levels are noted. Crude visual fields are within normal range.  RADIOLOGY RESULTS:  CT scans reviewed compatible with above-stated findings  PLAN: Present time patient is doing extremely well from a pulmonary standpoint.  Is had complete resolution of her previous right middle lobe nodule.  I have asked to see her back in 1 year for follow-up.  She follows with cardiology for assessment of her atherosclerosis detected on her CT scan.  Patient knows to call with any concerns.  I would like to take this opportunity to thank you for allowing me to participate in the care of your patient.Noreene Filbert, MD

## 2021-05-28 NOTE — Progress Notes (Signed)
Survivorship Care Plan visit completed.  Treatment summary reviewed and given to patient.  ASCO answers booklet reviewed and given to patient.  CARE program and Cancer Transitions discussed with patient along with other resources cancer center offers to patients and caregivers.       

## 2021-06-08 DIAGNOSIS — I2510 Atherosclerotic heart disease of native coronary artery without angina pectoris: Secondary | ICD-10-CM | POA: Diagnosis not present

## 2021-06-16 DIAGNOSIS — M62830 Muscle spasm of back: Secondary | ICD-10-CM | POA: Diagnosis not present

## 2021-06-16 DIAGNOSIS — M9903 Segmental and somatic dysfunction of lumbar region: Secondary | ICD-10-CM | POA: Diagnosis not present

## 2021-06-16 DIAGNOSIS — M9905 Segmental and somatic dysfunction of pelvic region: Secondary | ICD-10-CM | POA: Diagnosis not present

## 2021-06-16 DIAGNOSIS — M5416 Radiculopathy, lumbar region: Secondary | ICD-10-CM | POA: Diagnosis not present

## 2021-07-08 DIAGNOSIS — D2261 Melanocytic nevi of right upper limb, including shoulder: Secondary | ICD-10-CM | POA: Diagnosis not present

## 2021-07-08 DIAGNOSIS — D2271 Melanocytic nevi of right lower limb, including hip: Secondary | ICD-10-CM | POA: Diagnosis not present

## 2021-07-08 DIAGNOSIS — D2262 Melanocytic nevi of left upper limb, including shoulder: Secondary | ICD-10-CM | POA: Diagnosis not present

## 2021-07-08 DIAGNOSIS — L821 Other seborrheic keratosis: Secondary | ICD-10-CM | POA: Diagnosis not present

## 2021-07-08 DIAGNOSIS — D2272 Melanocytic nevi of left lower limb, including hip: Secondary | ICD-10-CM | POA: Diagnosis not present

## 2021-07-08 DIAGNOSIS — D225 Melanocytic nevi of trunk: Secondary | ICD-10-CM | POA: Diagnosis not present

## 2021-07-14 DIAGNOSIS — M9905 Segmental and somatic dysfunction of pelvic region: Secondary | ICD-10-CM | POA: Diagnosis not present

## 2021-07-14 DIAGNOSIS — M62830 Muscle spasm of back: Secondary | ICD-10-CM | POA: Diagnosis not present

## 2021-07-14 DIAGNOSIS — M5416 Radiculopathy, lumbar region: Secondary | ICD-10-CM | POA: Diagnosis not present

## 2021-07-14 DIAGNOSIS — M9903 Segmental and somatic dysfunction of lumbar region: Secondary | ICD-10-CM | POA: Diagnosis not present

## 2021-07-16 DIAGNOSIS — I700 Atherosclerosis of aorta: Secondary | ICD-10-CM | POA: Diagnosis not present

## 2021-07-16 DIAGNOSIS — I2510 Atherosclerotic heart disease of native coronary artery without angina pectoris: Secondary | ICD-10-CM | POA: Diagnosis not present

## 2021-07-16 DIAGNOSIS — I10 Essential (primary) hypertension: Secondary | ICD-10-CM | POA: Diagnosis not present

## 2021-07-22 DIAGNOSIS — I10 Essential (primary) hypertension: Secondary | ICD-10-CM | POA: Diagnosis not present

## 2021-07-22 DIAGNOSIS — N1831 Chronic kidney disease, stage 3a: Secondary | ICD-10-CM | POA: Diagnosis not present

## 2021-07-22 DIAGNOSIS — E785 Hyperlipidemia, unspecified: Secondary | ICD-10-CM | POA: Diagnosis not present

## 2021-07-29 DIAGNOSIS — I700 Atherosclerosis of aorta: Secondary | ICD-10-CM | POA: Diagnosis not present

## 2021-07-29 DIAGNOSIS — I10 Essential (primary) hypertension: Secondary | ICD-10-CM | POA: Diagnosis not present

## 2021-07-29 DIAGNOSIS — N1831 Chronic kidney disease, stage 3a: Secondary | ICD-10-CM | POA: Diagnosis not present

## 2021-07-29 DIAGNOSIS — E785 Hyperlipidemia, unspecified: Secondary | ICD-10-CM | POA: Diagnosis not present

## 2021-07-29 DIAGNOSIS — N390 Urinary tract infection, site not specified: Secondary | ICD-10-CM | POA: Diagnosis not present

## 2021-07-29 DIAGNOSIS — I2510 Atherosclerotic heart disease of native coronary artery without angina pectoris: Secondary | ICD-10-CM | POA: Diagnosis not present

## 2021-07-29 DIAGNOSIS — C342 Malignant neoplasm of middle lobe, bronchus or lung: Secondary | ICD-10-CM | POA: Diagnosis not present

## 2021-08-11 DIAGNOSIS — M9903 Segmental and somatic dysfunction of lumbar region: Secondary | ICD-10-CM | POA: Diagnosis not present

## 2021-08-11 DIAGNOSIS — M62830 Muscle spasm of back: Secondary | ICD-10-CM | POA: Diagnosis not present

## 2021-08-11 DIAGNOSIS — M5416 Radiculopathy, lumbar region: Secondary | ICD-10-CM | POA: Diagnosis not present

## 2021-08-11 DIAGNOSIS — M9905 Segmental and somatic dysfunction of pelvic region: Secondary | ICD-10-CM | POA: Diagnosis not present

## 2021-09-08 DIAGNOSIS — M5416 Radiculopathy, lumbar region: Secondary | ICD-10-CM | POA: Diagnosis not present

## 2021-09-08 DIAGNOSIS — M62830 Muscle spasm of back: Secondary | ICD-10-CM | POA: Diagnosis not present

## 2021-09-08 DIAGNOSIS — M9903 Segmental and somatic dysfunction of lumbar region: Secondary | ICD-10-CM | POA: Diagnosis not present

## 2021-09-08 DIAGNOSIS — M9905 Segmental and somatic dysfunction of pelvic region: Secondary | ICD-10-CM | POA: Diagnosis not present

## 2021-09-16 DIAGNOSIS — E785 Hyperlipidemia, unspecified: Secondary | ICD-10-CM | POA: Diagnosis not present

## 2021-10-06 DIAGNOSIS — M62830 Muscle spasm of back: Secondary | ICD-10-CM | POA: Diagnosis not present

## 2021-10-06 DIAGNOSIS — M5416 Radiculopathy, lumbar region: Secondary | ICD-10-CM | POA: Diagnosis not present

## 2021-10-06 DIAGNOSIS — M9905 Segmental and somatic dysfunction of pelvic region: Secondary | ICD-10-CM | POA: Diagnosis not present

## 2021-10-06 DIAGNOSIS — M9903 Segmental and somatic dysfunction of lumbar region: Secondary | ICD-10-CM | POA: Diagnosis not present

## 2021-11-03 DIAGNOSIS — M62830 Muscle spasm of back: Secondary | ICD-10-CM | POA: Diagnosis not present

## 2021-11-03 DIAGNOSIS — M5416 Radiculopathy, lumbar region: Secondary | ICD-10-CM | POA: Diagnosis not present

## 2021-11-03 DIAGNOSIS — M9905 Segmental and somatic dysfunction of pelvic region: Secondary | ICD-10-CM | POA: Diagnosis not present

## 2021-11-03 DIAGNOSIS — M9903 Segmental and somatic dysfunction of lumbar region: Secondary | ICD-10-CM | POA: Diagnosis not present

## 2021-12-01 DIAGNOSIS — M62830 Muscle spasm of back: Secondary | ICD-10-CM | POA: Diagnosis not present

## 2021-12-01 DIAGNOSIS — M5416 Radiculopathy, lumbar region: Secondary | ICD-10-CM | POA: Diagnosis not present

## 2021-12-01 DIAGNOSIS — M9905 Segmental and somatic dysfunction of pelvic region: Secondary | ICD-10-CM | POA: Diagnosis not present

## 2021-12-01 DIAGNOSIS — M9903 Segmental and somatic dysfunction of lumbar region: Secondary | ICD-10-CM | POA: Diagnosis not present

## 2021-12-29 DIAGNOSIS — M62830 Muscle spasm of back: Secondary | ICD-10-CM | POA: Diagnosis not present

## 2021-12-29 DIAGNOSIS — M5416 Radiculopathy, lumbar region: Secondary | ICD-10-CM | POA: Diagnosis not present

## 2021-12-29 DIAGNOSIS — M9905 Segmental and somatic dysfunction of pelvic region: Secondary | ICD-10-CM | POA: Diagnosis not present

## 2021-12-29 DIAGNOSIS — M9903 Segmental and somatic dysfunction of lumbar region: Secondary | ICD-10-CM | POA: Diagnosis not present

## 2022-01-06 IMAGING — CT NM PET TUM IMG INITIAL (PI) SKULL BASE T - THIGH
1 of 9 series · 1 of 25 positions shown · non-contrast
Comparison: CT chest 09/26/2019, 07/31/2019.

CLINICAL DATA: Initial treatment strategy for lung nodule.

EXAM:
NUCLEAR MEDICINE PET SKULL BASE TO THIGH
TECHNIQUE: 7.9 mCi F-18 FDG was injected intravenously. Full-ring PET imaging
was performed from the skull base to thigh after the radiotracer. CT
data was obtained and used for attenuation correction and anatomic
localization.
Fasting blood glucose: 90 mg/dl

[Series 3: ct wb 5.0 b30f · axial · 5.0mm · 0.98mm/px · 1 of 290 slices shown]
[im 290/290  brain]
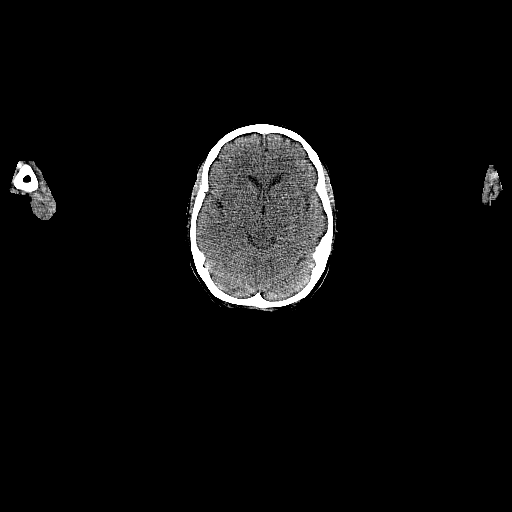

[1 of 25 positions shown; findings below may reference images not displayed]

FINDINGS: Mediastinal blood pool activity: SUV max

Liver activity: SUV max NA

NECK:

Focal hypermetabolism associated with a 10 x 12 mm right parotid
nodule ([DATE]) has an SUV max of 3.7. No hypermetabolic lymph nodes.

Incidental CT findings:

None.

CHEST:

No hypermetabolic mediastinal, hilar or axillary lymph nodes. 12 mm
right middle lobe nodule (11 x 13 mm, 3/105) is hypermetabolic, SUV
max 4.3. Nodule is stable in size from baseline examination
07/31/2019. Other millimetric pulmonary nodules are too small for
PET resolution.

Incidental CT findings:

Atherosclerotic calcification of the aorta. Heart is enlarged. No
pericardial or pleural effusion. Additional millimetric pulmonary
nodules are unchanged.

ABDOMEN/PELVIS:

No abnormal hypermetabolism in the liver, adrenal glands, spleen or
pancreas. No hypermetabolic lymph nodes.

Incidental CT findings:

Liver, gallbladder and adrenal glands are unremarkable.
Low-attenuation lesion off the lower pole right kidney measures
cm and is likely a cyst. Spleen, pancreas, stomach and bowel are
grossly unremarkable. Small umbilical hernia contains fat.
Atherosclerotic calcification of the aorta. No free fluid.

SKELETON: No abnormal hypermetabolism.

Incidental CT findings:

Degenerative changes in the spine.
IMPRESSION: 1. Hypermetabolic right middle lobe nodule, indicative of stage IA
primary bronchogenic carcinoma.
2. Additional millimetric pulmonary nodules are too small for PET
resolution.
3. Hypermetabolic right parotid nodule. Malignancy cannot be
excluded.
4.  Aortic atherosclerosis ([id number]).

## 2022-01-15 ENCOUNTER — Ambulatory Visit (LOCAL_COMMUNITY_HEALTH_CENTER): Payer: PPO

## 2022-01-15 DIAGNOSIS — Z23 Encounter for immunization: Secondary | ICD-10-CM | POA: Diagnosis not present

## 2022-01-15 NOTE — Progress Notes (Signed)
  Are you feeling sick today? No   Have you ever received a dose of COVID-19 Vaccine? [employer], [place], [place], [place], Other) Yes  If yes, which vaccine and how many doses?    4 doses Pfizer  Did you bring the vaccination record card or other documentation?  Yes   Do you have a health condition or are undergoing treatment that makes you moderately or severely immunocompromised? This would include, but not be limited to: cancer, HIV, organ transplant, immunosuppressive therapy/high-dose corticosteroids, or moderate/severe primary immunodeficiency.  No  Have you received COVID-19 vaccine before or during hematopoietic cell transplant (HCT) or CAR-T-cell therapies? Yes  Have you ever had an allergic reaction to: (This would include a severe allergic reaction or a reaction that caused hives, swelling, or respiratory distress, including wheezing.) A component of a COVID-19 vaccine or a previous dose of COVID-19 vaccine? No   Have you ever had an allergic reaction to another vaccine (other thanCOVID-19 vaccine) or an injectable medication? (This would include a severe allergic reaction or a reaction that caused hives, swelling, or respiratory distress, including wheezing.)   No    Do you have a history of any of the following:  Myocarditis or Pericarditis No  Dermal fillers:  No  Multisystem Inflammatory Syndrome (MIS-C or MIS-A)? No  COVID-19 disease within the past 3 months? No  Vaccinated with monkeypox vaccine in the last 4 weeks? No

## 2022-01-26 DIAGNOSIS — M9903 Segmental and somatic dysfunction of lumbar region: Secondary | ICD-10-CM | POA: Diagnosis not present

## 2022-01-26 DIAGNOSIS — M5416 Radiculopathy, lumbar region: Secondary | ICD-10-CM | POA: Diagnosis not present

## 2022-01-26 DIAGNOSIS — M9905 Segmental and somatic dysfunction of pelvic region: Secondary | ICD-10-CM | POA: Diagnosis not present

## 2022-01-26 DIAGNOSIS — M62830 Muscle spasm of back: Secondary | ICD-10-CM | POA: Diagnosis not present

## 2022-01-27 DIAGNOSIS — E785 Hyperlipidemia, unspecified: Secondary | ICD-10-CM | POA: Diagnosis not present

## 2022-01-27 DIAGNOSIS — I10 Essential (primary) hypertension: Secondary | ICD-10-CM | POA: Diagnosis not present

## 2022-01-27 DIAGNOSIS — N1831 Chronic kidney disease, stage 3a: Secondary | ICD-10-CM | POA: Diagnosis not present

## 2022-02-03 ENCOUNTER — Other Ambulatory Visit: Payer: Self-pay | Admitting: Internal Medicine

## 2022-02-03 DIAGNOSIS — N1831 Chronic kidney disease, stage 3a: Secondary | ICD-10-CM | POA: Diagnosis not present

## 2022-02-03 DIAGNOSIS — E785 Hyperlipidemia, unspecified: Secondary | ICD-10-CM | POA: Diagnosis not present

## 2022-02-03 DIAGNOSIS — Z1231 Encounter for screening mammogram for malignant neoplasm of breast: Secondary | ICD-10-CM

## 2022-02-03 DIAGNOSIS — Z85118 Personal history of other malignant neoplasm of bronchus and lung: Secondary | ICD-10-CM | POA: Diagnosis not present

## 2022-02-03 DIAGNOSIS — M8589 Other specified disorders of bone density and structure, multiple sites: Secondary | ICD-10-CM | POA: Diagnosis not present

## 2022-02-03 DIAGNOSIS — I700 Atherosclerosis of aorta: Secondary | ICD-10-CM | POA: Diagnosis not present

## 2022-02-03 DIAGNOSIS — I10 Essential (primary) hypertension: Secondary | ICD-10-CM | POA: Diagnosis not present

## 2022-02-03 DIAGNOSIS — Z0000 Encounter for general adult medical examination without abnormal findings: Secondary | ICD-10-CM | POA: Diagnosis not present

## 2022-02-08 DIAGNOSIS — M9903 Segmental and somatic dysfunction of lumbar region: Secondary | ICD-10-CM | POA: Diagnosis not present

## 2022-02-08 DIAGNOSIS — M5417 Radiculopathy, lumbosacral region: Secondary | ICD-10-CM | POA: Diagnosis not present

## 2022-02-08 DIAGNOSIS — M9905 Segmental and somatic dysfunction of pelvic region: Secondary | ICD-10-CM | POA: Diagnosis not present

## 2022-02-08 DIAGNOSIS — M5431 Sciatica, right side: Secondary | ICD-10-CM | POA: Diagnosis not present

## 2022-02-16 DIAGNOSIS — M8589 Other specified disorders of bone density and structure, multiple sites: Secondary | ICD-10-CM | POA: Diagnosis not present

## 2022-02-23 ENCOUNTER — Ambulatory Visit
Admission: RE | Admit: 2022-02-23 | Discharge: 2022-02-23 | Disposition: A | Discharge status: Home / Self Care | Payer: PPO | Source: Ambulatory Visit | Attending: Internal Medicine | Admitting: Internal Medicine

## 2022-02-23 DIAGNOSIS — Z1231 Encounter for screening mammogram for malignant neoplasm of breast: Secondary | ICD-10-CM | POA: Insufficient documentation

## 2022-03-08 DIAGNOSIS — M5417 Radiculopathy, lumbosacral region: Secondary | ICD-10-CM | POA: Diagnosis not present

## 2022-03-08 DIAGNOSIS — M5431 Sciatica, right side: Secondary | ICD-10-CM | POA: Diagnosis not present

## 2022-03-08 DIAGNOSIS — M9903 Segmental and somatic dysfunction of lumbar region: Secondary | ICD-10-CM | POA: Diagnosis not present

## 2022-03-08 DIAGNOSIS — M9905 Segmental and somatic dysfunction of pelvic region: Secondary | ICD-10-CM | POA: Diagnosis not present

## 2022-03-31 DIAGNOSIS — M5417 Radiculopathy, lumbosacral region: Secondary | ICD-10-CM | POA: Diagnosis not present

## 2022-03-31 DIAGNOSIS — M9903 Segmental and somatic dysfunction of lumbar region: Secondary | ICD-10-CM | POA: Diagnosis not present

## 2022-03-31 DIAGNOSIS — M5431 Sciatica, right side: Secondary | ICD-10-CM | POA: Diagnosis not present

## 2022-03-31 DIAGNOSIS — M9905 Segmental and somatic dysfunction of pelvic region: Secondary | ICD-10-CM | POA: Diagnosis not present

## 2022-04-28 DIAGNOSIS — M5431 Sciatica, right side: Secondary | ICD-10-CM | POA: Diagnosis not present

## 2022-04-28 DIAGNOSIS — M9903 Segmental and somatic dysfunction of lumbar region: Secondary | ICD-10-CM | POA: Diagnosis not present

## 2022-04-28 DIAGNOSIS — M9905 Segmental and somatic dysfunction of pelvic region: Secondary | ICD-10-CM | POA: Diagnosis not present

## 2022-04-28 DIAGNOSIS — M5417 Radiculopathy, lumbosacral region: Secondary | ICD-10-CM | POA: Diagnosis not present

## 2022-05-17 DIAGNOSIS — H1031 Unspecified acute conjunctivitis, right eye: Secondary | ICD-10-CM | POA: Diagnosis not present

## 2022-05-25 ENCOUNTER — Ambulatory Visit
Admission: RE | Admit: 2022-05-25 | Discharge: 2022-05-25 | Disposition: A | Discharge status: Home / Self Care | Payer: PPO | Source: Ambulatory Visit | Attending: Radiation Oncology | Admitting: Radiation Oncology

## 2022-05-25 DIAGNOSIS — R918 Other nonspecific abnormal finding of lung field: Secondary | ICD-10-CM | POA: Diagnosis not present

## 2022-05-25 DIAGNOSIS — R911 Solitary pulmonary nodule: Secondary | ICD-10-CM | POA: Diagnosis not present

## 2022-05-25 DIAGNOSIS — C3490 Malignant neoplasm of unspecified part of unspecified bronchus or lung: Secondary | ICD-10-CM | POA: Diagnosis not present

## 2022-05-25 LAB — POCT I-STAT CREATININE: Creatinine, Ser: 1.2 mg/dL — ABNORMAL HIGH (ref 0.44–1.00)

## 2022-05-25 MED ORDER — IOHEXOL 300 MG/ML  SOLN
75.0000 mL | Freq: Once | INTRAMUSCULAR | Status: AC | PRN
  Administered 2022-05-25: 75 mL via INTRAVENOUS

## 2022-05-26 DIAGNOSIS — M9905 Segmental and somatic dysfunction of pelvic region: Secondary | ICD-10-CM | POA: Diagnosis not present

## 2022-05-26 DIAGNOSIS — M9903 Segmental and somatic dysfunction of lumbar region: Secondary | ICD-10-CM | POA: Diagnosis not present

## 2022-05-26 DIAGNOSIS — M5431 Sciatica, right side: Secondary | ICD-10-CM | POA: Diagnosis not present

## 2022-05-26 DIAGNOSIS — M5417 Radiculopathy, lumbosacral region: Secondary | ICD-10-CM | POA: Diagnosis not present

## 2022-05-27 ENCOUNTER — Encounter: Payer: Self-pay | Admitting: Radiation Oncology

## 2022-05-27 ENCOUNTER — Ambulatory Visit
Admission: RE | Admit: 2022-05-27 | Discharge: 2022-05-27 | Disposition: A | Discharge status: Home / Self Care | Payer: PPO | Source: Ambulatory Visit | Attending: Radiation Oncology | Admitting: Radiation Oncology

## 2022-05-27 ENCOUNTER — Other Ambulatory Visit: Payer: Self-pay | Admitting: *Deleted

## 2022-05-27 VITALS — BP 134/81 | HR 71 | Temp 97.3°F | Resp 16 | Ht 65.0 in | Wt 138.7 lb

## 2022-05-27 DIAGNOSIS — Z923 Personal history of irradiation: Secondary | ICD-10-CM | POA: Insufficient documentation

## 2022-05-27 DIAGNOSIS — R911 Solitary pulmonary nodule: Secondary | ICD-10-CM

## 2022-05-27 NOTE — Progress Notes (Signed)
Radiation Oncology Follow up Note  Name: Melissa Harvey   Date:   05/27/2022 MRN:  [id number] DOB: 15-Feb-1941    This 82 y.o. female presents to the clinic today for 2-year follow-up status post SBRT to her right middle lobe for presumed stage I non-small cell lung cancer.  REFERRING PROVIDER: Adin Hector, MD  HPI: Patient is a 82 year old female now out 2 years having completed SBRT to her right middle lobe for presumed stage I lung cancer.  Seen today in routine follow-up she is doing well.  She specifically denies increased cough hemoptysis or chest tightness..  Recent CT scan showed no specific findings to suggest residual or recurrent disease with stable appearance of scarring and architectural distortion in the right lower lobe compatible changes secondary to radiation.  COMPLICATIONS OF TREATMENT: none  FOLLOW UP COMPLIANCE: keeps appointments   PHYSICAL EXAM:  BP 134/81 (BP Location: Right Arm, Patient Position: Sitting, Cuff Size: Normal)   Pulse 71   Temp (!) 97.3 F (36.3 C) (Tympanic)   Resp 16   Ht 5\' 5"  (1.651 m) Comment: Stated Ht  Wt 138 lb 11.2 oz (62.9 kg)   BMI 23.08 kg/m  Well-developed well-nourished patient in NAD. HEENT reveals PERLA, EOMI, discs not visualized.  Oral cavity is clear. No oral mucosal lesions are identified. Neck is clear without evidence of cervical or supraclavicular adenopathy. Lungs are clear to A&P. Cardiac examination is essentially unremarkable with regular rate and rhythm without murmur rub or thrill. Abdomen is benign with no organomegaly or masses noted. Motor sensory and DTR levels are equal and symmetric in the upper and lower extremities. Cranial nerves II through XII are grossly intact. Proprioception is intact. No peripheral adenopathy or edema is identified. No motor or sensory levels are noted. Crude visual fields are within normal range.  RADIOLOGY RESULTS: Serial CT scans reviewed compatible with above-stated  findings  PLAN: Present time patient is doing extremely well very low side effect profile with stable results by CT criteria and pleased with her overall progress.  Of asked to see her back in 1 year for follow-up with a repeat CT scan should that be stable at that time we will refer her back to her PMD for continued surveillance.  Patient is to call with any concerns.  I would like to take this opportunity to thank you for allowing me to participate in the care of your patient.Noreene Filbert, MD

## 2022-06-23 DIAGNOSIS — M9905 Segmental and somatic dysfunction of pelvic region: Secondary | ICD-10-CM | POA: Diagnosis not present

## 2022-06-23 DIAGNOSIS — M9903 Segmental and somatic dysfunction of lumbar region: Secondary | ICD-10-CM | POA: Diagnosis not present

## 2022-06-23 DIAGNOSIS — M5431 Sciatica, right side: Secondary | ICD-10-CM | POA: Diagnosis not present

## 2022-06-23 DIAGNOSIS — M5417 Radiculopathy, lumbosacral region: Secondary | ICD-10-CM | POA: Diagnosis not present

## 2022-07-05 DIAGNOSIS — M9905 Segmental and somatic dysfunction of pelvic region: Secondary | ICD-10-CM | POA: Diagnosis not present

## 2022-07-05 DIAGNOSIS — M9903 Segmental and somatic dysfunction of lumbar region: Secondary | ICD-10-CM | POA: Diagnosis not present

## 2022-07-05 DIAGNOSIS — M5417 Radiculopathy, lumbosacral region: Secondary | ICD-10-CM | POA: Diagnosis not present

## 2022-07-05 DIAGNOSIS — M5431 Sciatica, right side: Secondary | ICD-10-CM | POA: Diagnosis not present

## 2022-07-07 DIAGNOSIS — M9905 Segmental and somatic dysfunction of pelvic region: Secondary | ICD-10-CM | POA: Diagnosis not present

## 2022-07-07 DIAGNOSIS — M5417 Radiculopathy, lumbosacral region: Secondary | ICD-10-CM | POA: Diagnosis not present

## 2022-07-07 DIAGNOSIS — M9903 Segmental and somatic dysfunction of lumbar region: Secondary | ICD-10-CM | POA: Diagnosis not present

## 2022-07-07 DIAGNOSIS — M5431 Sciatica, right side: Secondary | ICD-10-CM | POA: Diagnosis not present

## 2022-07-12 DIAGNOSIS — M5417 Radiculopathy, lumbosacral region: Secondary | ICD-10-CM | POA: Diagnosis not present

## 2022-07-12 DIAGNOSIS — M9905 Segmental and somatic dysfunction of pelvic region: Secondary | ICD-10-CM | POA: Diagnosis not present

## 2022-07-12 DIAGNOSIS — M5431 Sciatica, right side: Secondary | ICD-10-CM | POA: Diagnosis not present

## 2022-07-12 DIAGNOSIS — M9903 Segmental and somatic dysfunction of lumbar region: Secondary | ICD-10-CM | POA: Diagnosis not present

## 2022-07-13 DIAGNOSIS — M5136 Other intervertebral disc degeneration, lumbar region: Secondary | ICD-10-CM | POA: Diagnosis not present

## 2022-07-13 DIAGNOSIS — M47816 Spondylosis without myelopathy or radiculopathy, lumbar region: Secondary | ICD-10-CM | POA: Diagnosis not present

## 2022-07-13 DIAGNOSIS — M79661 Pain in right lower leg: Secondary | ICD-10-CM | POA: Diagnosis not present

## 2022-07-13 DIAGNOSIS — M1611 Unilateral primary osteoarthritis, right hip: Secondary | ICD-10-CM | POA: Diagnosis not present

## 2022-07-13 DIAGNOSIS — M5441 Lumbago with sciatica, right side: Secondary | ICD-10-CM | POA: Diagnosis not present

## 2022-07-13 DIAGNOSIS — M76891 Other specified enthesopathies of right lower limb, excluding foot: Secondary | ICD-10-CM | POA: Diagnosis not present

## 2022-07-13 DIAGNOSIS — G8929 Other chronic pain: Secondary | ICD-10-CM | POA: Diagnosis not present

## 2022-07-13 DIAGNOSIS — M25551 Pain in right hip: Secondary | ICD-10-CM | POA: Diagnosis not present

## 2022-07-25 NOTE — Progress Notes (Unsigned)
Cardiology Office Note  Date:  07/26/2022   ID:  SIOMARA Harvey, DOB 14-Apr-1940, MRN [id number]  PCP:  Lynnea Ferrier, MD   Chief Complaint  Patient presents with   New Patient (Initial Visit)    Establish care for HTN and in 2023 patient had a Chest CT showing aortic atherosclerosis & calcified atherosclerotic plaque in the RCA. Chest CT in 2024 showing aortic atherosclerosis calcifications. Denies chest pain or shortness of breath. Medications reviewed by the patient verbaly.     HPI:  [name] is a 82 year old woman with past medical history of Coronary artery calcification non-small cell lung cancer (s/p radiation and sees completed November 2021),  hypertension,  hyperlipidemia,  CKD 3  Who presents by referral from Dr. Worthy Keeler consultation of her coronary disease Previously seen by Gavin Potters, last seen July 16, 2021  In follow-up reports she feels well, denies shortness of breath, no chest pain  CT scan chest images pulled up and reviewed CT scan for lung cancer surveillance February 2024  noted very mild aortic atherosclerosis as well as very mild right coronary artery calcium.   Lab work reviewed Total cholesterol 160 LDL 90 Side effects on crestor, changed to once a week  Stress test February 2023 No significant ischemia  Back issues, pitched nerve  Bp 108-120/130 systolic/60 to 70  EKG personally reviewed by myself on todays visit Sinus bradycardia rate 56 bpm no significant ST or T wave changes  PMH:   has a past medical history of Allergic rhinitis, Cancer, Chronic kidney disease, Colon polyps, and Hypertension.  PSH:    Past Surgical History:  Procedure Laterality Date   COLONOSCOPY WITH PROPOFOL N/A 12/07/2018   Procedure: COLONOSCOPY WITH PROPOFOL;  Surgeon: Christena Deem, MD;  Location: [facility] ENDOSCOPY;  Service: Endoscopy;  Laterality: N/A;   PAROTIDECTOMY Right 04/09/2020   Procedure: PAROTIDECTOMY;  Surgeon: Geanie Logan, MD;   Location: [facility] ORS;  Service: ENT;  Laterality: Right;   TONSILLECTOMY      Current Outpatient Medications  Medication Sig Dispense Refill   acetaminophen (TYLENOL) 500 MG tablet Take 500 mg by mouth every 6 (six) hours as needed for moderate pain.     alendronate (FOSAMAX) 70 MG tablet Take 70 mg by mouth once a week.     amLODipine (NORVASC) 2.5 MG tablet Take 2.5 mg by mouth every morning.      aspirin 81 MG chewable tablet Chew by mouth daily.     Calcium Carbonate-Vitamin D (CALCIUM + D PO) Take 1 each by mouth daily.     Cholecalciferol (VITAMIN D) 50 MCG (2000 UT) tablet Take 2,000 Units by mouth daily.     CRANBERRY PO Take 500 mg by mouth daily.     cyanocobalamin (VITAMIN B12) 1000 MCG tablet Take 1,000 mcg by mouth daily.     ezetimibe (ZETIA) 10 MG tablet Take 1 tablet (10 mg total) by mouth daily. 90 tablet 3   magnesium oxide (MAG-OX) 400 (240 Mg) MG tablet Take 400 mg by mouth daily.     Probiotic Product (PROBIOTIC DAILY PO) Take 1 capsule by mouth daily.     No current facility-administered medications for this visit.     Allergies:   Alendronate   Social History:  The patient  reports that she has never smoked. She has never used smokeless tobacco. She reports that she does not drink alcohol and does not use drugs.   Family History:   family history includes Breast  cancer (age of onset: 69) in her maternal grandmother; Heart attack in her brother.    Review of Systems: Review of Systems  Constitutional: Negative.   HENT: Negative.    Respiratory: Negative.    Cardiovascular: Negative.   Gastrointestinal: Negative.   Musculoskeletal: Negative.   Neurological: Negative.   Psychiatric/Behavioral: Negative.    All other systems reviewed and are negative.   PHYSICAL EXAM: VS:  BP 135/80 (BP Location: Left Arm)   Pulse (!) 56   Ht 5\' 5"  (1.651 m)   Wt 139 lb 6 oz (63.2 kg)   SpO2 98%   BMI 23.19 kg/m  , BMI Body mass index is 23.19 kg/m. GEN: Well  nourished, well developed, in no acute distress HEENT: normal Neck: no JVD, carotid bruits, or masses Cardiac: RRR; no murmurs, rubs, or gallops,no edema  Respiratory:  clear to auscultation bilaterally, normal work of breathing GI: soft, nontender, nondistended, + BS MS: no deformity or atrophy Skin: warm and dry, no rash Neuro:  Strength and sensation are intact Psych: euthymic mood, full affect   Recent Labs: 05/25/2022: Creatinine, Ser 1.20    Lipid Panel No results found for: "CHOL", "HDL", "LDLCALC", "TRIG"    Wt Readings from Last 3 Encounters:  07/26/22 139 lb 6 oz (63.2 kg)  05/27/22 138 lb 11.2 oz (62.9 kg)  05/28/21 138 lb 11.2 oz (62.9 kg)      ASSESSMENT AND PLAN:  Problem List Items Addressed This Visit   None Visit Diagnoses     Coronary artery disease of native artery of native heart with stable angina pectoris    -  Primary   Relevant Medications   ezetimibe (ZETIA) 10 MG tablet   Other Relevant Orders   EKG 12-Lead   Aortic atherosclerosis       Relevant Medications   ezetimibe (ZETIA) 10 MG tablet   Other Relevant Orders   EKG 12-Lead   Benign essential HTN       Relevant Medications   ezetimibe (ZETIA) 10 MG tablet      Aortic atherosclerosis CT scan images reviewed, very mild disease noted Difficulty tolerating statin, we will stop the Crestor at her request Recommend she wait several weeks time, consider starting Zetia at that time  Coronary calcification Minimal calcification noted in the RCA noted on images discussed with her today Underwhelmed by the amount of calcium Denies shortness of breath or anginal symptoms, active at baseline  Essential hypertension Blood pressure is well controlled on today's visit.  Numbers controlled at home . no changes made to the medications.  Hyperlipidemia At her request recommend she stop Crestor which she was taking once a day Statin myalgias Will start Zetia 10 mg daily Ideally goal LDL less  than 70   Total encounter time more than 60 minutes  Greater than 50% was spent in counseling and coordination of care with the patient    Signed, Dossie Arbour, M.D., Ph.D. [facility] [place], [place] [phone]

## 2022-07-26 ENCOUNTER — Ambulatory Visit: Payer: PPO | Attending: Cardiovascular Disease | Admitting: Cardiovascular Disease

## 2022-07-26 ENCOUNTER — Encounter: Payer: Self-pay | Admitting: Cardiovascular Disease

## 2022-07-26 VITALS — BP 135/80 | HR 56 | Ht 65.0 in | Wt 139.4 lb

## 2022-07-26 DIAGNOSIS — I25118 Atherosclerotic heart disease of native coronary artery with other forms of angina pectoris: Secondary | ICD-10-CM

## 2022-07-26 DIAGNOSIS — I700 Atherosclerosis of aorta: Secondary | ICD-10-CM

## 2022-07-26 DIAGNOSIS — I10 Essential (primary) hypertension: Secondary | ICD-10-CM

## 2022-07-26 MED ORDER — EZETIMIBE 10 MG PO TABS: 10.0000 mg | ORAL_TABLET | Freq: Every day | ORAL | 3 refills | Status: DC

## 2022-07-26 NOTE — Patient Instructions (Addendum)
Medication Instructions:  Zetia 10 mg daily for cholesterol Stop crestor  CoQ10 for muscles  If you need a refill on your cardiac medications before your next appointment, please call your pharmacy.   Lab work: No new labs needed  Testing/Procedures: No new testing needed  Follow-Up: At [facility], you and your health needs are our priority.  As part of our continuing mission to provide you with exceptional heart care, we have created designated Provider Care Teams.  These Care Teams include your primary Cardiologist (physician) and Advanced Practice Providers (APPs -  Physician Assistants and Nurse Practitioners) who all work together to provide you with the care you need, when you need it.  You will need a follow up appointment in 12 months  Providers on your designated Care Team:   Nicolasa Ducking, NP Eula Listen, PA-C Cadence Fransico Michael, [place]  COVID-19 Vaccine Information can be found at: [email or web address] For questions related to vaccine distribution or appointments, please email [email or web address] or call 8478379764.

## 2022-07-29 DIAGNOSIS — E785 Hyperlipidemia, unspecified: Secondary | ICD-10-CM | POA: Diagnosis not present

## 2022-07-29 DIAGNOSIS — N1831 Chronic kidney disease, stage 3a: Secondary | ICD-10-CM | POA: Diagnosis not present

## 2022-08-02 DIAGNOSIS — M5417 Radiculopathy, lumbosacral region: Secondary | ICD-10-CM | POA: Diagnosis not present

## 2022-08-02 DIAGNOSIS — M9905 Segmental and somatic dysfunction of pelvic region: Secondary | ICD-10-CM | POA: Diagnosis not present

## 2022-08-02 DIAGNOSIS — M5431 Sciatica, right side: Secondary | ICD-10-CM | POA: Diagnosis not present

## 2022-08-02 DIAGNOSIS — M9903 Segmental and somatic dysfunction of lumbar region: Secondary | ICD-10-CM | POA: Diagnosis not present

## 2022-08-05 DIAGNOSIS — N390 Urinary tract infection, site not specified: Secondary | ICD-10-CM | POA: Diagnosis not present

## 2022-08-05 DIAGNOSIS — I700 Atherosclerosis of aorta: Secondary | ICD-10-CM | POA: Diagnosis not present

## 2022-08-05 DIAGNOSIS — E559 Vitamin D deficiency, unspecified: Secondary | ICD-10-CM | POA: Diagnosis not present

## 2022-08-05 DIAGNOSIS — E785 Hyperlipidemia, unspecified: Secondary | ICD-10-CM | POA: Diagnosis not present

## 2022-08-05 DIAGNOSIS — I10 Essential (primary) hypertension: Secondary | ICD-10-CM | POA: Diagnosis not present

## 2022-08-05 DIAGNOSIS — Z85118 Personal history of other malignant neoplasm of bronchus and lung: Secondary | ICD-10-CM | POA: Diagnosis not present

## 2022-08-05 DIAGNOSIS — N1831 Chronic kidney disease, stage 3a: Secondary | ICD-10-CM | POA: Diagnosis not present

## 2022-08-05 DIAGNOSIS — M353 Polymyalgia rheumatica: Secondary | ICD-10-CM | POA: Diagnosis not present

## 2022-08-05 DIAGNOSIS — I2510 Atherosclerotic heart disease of native coronary artery without angina pectoris: Secondary | ICD-10-CM | POA: Diagnosis not present

## 2022-08-11 DIAGNOSIS — L821 Other seborrheic keratosis: Secondary | ICD-10-CM | POA: Diagnosis not present

## 2022-08-11 DIAGNOSIS — D2262 Melanocytic nevi of left upper limb, including shoulder: Secondary | ICD-10-CM | POA: Diagnosis not present

## 2022-08-11 DIAGNOSIS — L814 Other melanin hyperpigmentation: Secondary | ICD-10-CM | POA: Diagnosis not present

## 2022-08-11 DIAGNOSIS — D2261 Melanocytic nevi of right upper limb, including shoulder: Secondary | ICD-10-CM | POA: Diagnosis not present

## 2022-08-11 DIAGNOSIS — D2272 Melanocytic nevi of left lower limb, including hip: Secondary | ICD-10-CM | POA: Diagnosis not present

## 2022-08-11 DIAGNOSIS — L720 Epidermal cyst: Secondary | ICD-10-CM | POA: Diagnosis not present

## 2022-08-11 DIAGNOSIS — D225 Melanocytic nevi of trunk: Secondary | ICD-10-CM | POA: Diagnosis not present

## 2022-08-11 DIAGNOSIS — L570 Actinic keratosis: Secondary | ICD-10-CM | POA: Diagnosis not present

## 2022-08-11 DIAGNOSIS — D2271 Melanocytic nevi of right lower limb, including hip: Secondary | ICD-10-CM | POA: Diagnosis not present

## 2022-08-11 DIAGNOSIS — L918 Other hypertrophic disorders of the skin: Secondary | ICD-10-CM | POA: Diagnosis not present

## 2022-08-30 DIAGNOSIS — M5417 Radiculopathy, lumbosacral region: Secondary | ICD-10-CM | POA: Diagnosis not present

## 2022-08-30 DIAGNOSIS — M5431 Sciatica, right side: Secondary | ICD-10-CM | POA: Diagnosis not present

## 2022-08-30 DIAGNOSIS — M9905 Segmental and somatic dysfunction of pelvic region: Secondary | ICD-10-CM | POA: Diagnosis not present

## 2022-08-30 DIAGNOSIS — M9903 Segmental and somatic dysfunction of lumbar region: Secondary | ICD-10-CM | POA: Diagnosis not present

## 2022-09-27 DIAGNOSIS — M9905 Segmental and somatic dysfunction of pelvic region: Secondary | ICD-10-CM | POA: Diagnosis not present

## 2022-09-27 DIAGNOSIS — M5431 Sciatica, right side: Secondary | ICD-10-CM | POA: Diagnosis not present

## 2022-09-27 DIAGNOSIS — M5417 Radiculopathy, lumbosacral region: Secondary | ICD-10-CM | POA: Diagnosis not present

## 2022-09-27 DIAGNOSIS — M9903 Segmental and somatic dysfunction of lumbar region: Secondary | ICD-10-CM | POA: Diagnosis not present

## 2022-10-20 DIAGNOSIS — M47816 Spondylosis without myelopathy or radiculopathy, lumbar region: Secondary | ICD-10-CM | POA: Diagnosis not present

## 2022-10-20 DIAGNOSIS — M1611 Unilateral primary osteoarthritis, right hip: Secondary | ICD-10-CM | POA: Diagnosis not present

## 2022-10-20 DIAGNOSIS — M5441 Lumbago with sciatica, right side: Secondary | ICD-10-CM | POA: Diagnosis not present

## 2022-10-20 DIAGNOSIS — M5442 Lumbago with sciatica, left side: Secondary | ICD-10-CM | POA: Diagnosis not present

## 2022-10-20 DIAGNOSIS — M79661 Pain in right lower leg: Secondary | ICD-10-CM | POA: Diagnosis not present

## 2022-10-20 DIAGNOSIS — G8929 Other chronic pain: Secondary | ICD-10-CM | POA: Diagnosis not present

## 2022-10-20 DIAGNOSIS — M5136 Other intervertebral disc degeneration, lumbar region: Secondary | ICD-10-CM | POA: Diagnosis not present

## 2022-10-20 DIAGNOSIS — M25551 Pain in right hip: Secondary | ICD-10-CM | POA: Diagnosis not present

## 2022-10-20 DIAGNOSIS — M76891 Other specified enthesopathies of right lower limb, excluding foot: Secondary | ICD-10-CM | POA: Diagnosis not present

## 2022-10-21 ENCOUNTER — Other Ambulatory Visit: Payer: Self-pay | Admitting: Sports Medicine

## 2022-10-21 DIAGNOSIS — M5136 Other intervertebral disc degeneration, lumbar region: Secondary | ICD-10-CM

## 2022-10-21 DIAGNOSIS — G8929 Other chronic pain: Secondary | ICD-10-CM

## 2022-10-21 DIAGNOSIS — M47816 Spondylosis without myelopathy or radiculopathy, lumbar region: Secondary | ICD-10-CM

## 2022-10-27 DIAGNOSIS — M9903 Segmental and somatic dysfunction of lumbar region: Secondary | ICD-10-CM | POA: Diagnosis not present

## 2022-10-27 DIAGNOSIS — M5431 Sciatica, right side: Secondary | ICD-10-CM | POA: Diagnosis not present

## 2022-10-27 DIAGNOSIS — M9905 Segmental and somatic dysfunction of pelvic region: Secondary | ICD-10-CM | POA: Diagnosis not present

## 2022-10-27 DIAGNOSIS — M5417 Radiculopathy, lumbosacral region: Secondary | ICD-10-CM | POA: Diagnosis not present

## 2022-10-30 ENCOUNTER — Ambulatory Visit
Admission: RE | Admit: 2022-10-30 | Discharge: 2022-10-30 | Disposition: A | Discharge status: Home / Self Care | Payer: PPO | Source: Ambulatory Visit | Attending: Sports Medicine | Admitting: Sports Medicine

## 2022-10-30 DIAGNOSIS — M5136 Other intervertebral disc degeneration, lumbar region: Secondary | ICD-10-CM | POA: Insufficient documentation

## 2022-10-30 DIAGNOSIS — M5442 Lumbago with sciatica, left side: Secondary | ICD-10-CM | POA: Insufficient documentation

## 2022-10-30 DIAGNOSIS — G8929 Other chronic pain: Secondary | ICD-10-CM | POA: Diagnosis not present

## 2022-10-30 DIAGNOSIS — M47816 Spondylosis without myelopathy or radiculopathy, lumbar region: Secondary | ICD-10-CM | POA: Insufficient documentation

## 2022-10-30 DIAGNOSIS — M5441 Lumbago with sciatica, right side: Secondary | ICD-10-CM | POA: Insufficient documentation

## 2022-10-30 DIAGNOSIS — M5117 Intervertebral disc disorders with radiculopathy, lumbosacral region: Secondary | ICD-10-CM | POA: Diagnosis not present

## 2022-10-30 DIAGNOSIS — M47817 Spondylosis without myelopathy or radiculopathy, lumbosacral region: Secondary | ICD-10-CM | POA: Diagnosis not present

## 2022-10-30 DIAGNOSIS — M5116 Intervertebral disc disorders with radiculopathy, lumbar region: Secondary | ICD-10-CM | POA: Diagnosis not present

## 2022-11-15 DIAGNOSIS — M5417 Radiculopathy, lumbosacral region: Secondary | ICD-10-CM | POA: Diagnosis not present

## 2022-11-15 DIAGNOSIS — M5431 Sciatica, right side: Secondary | ICD-10-CM | POA: Diagnosis not present

## 2022-11-15 DIAGNOSIS — M9903 Segmental and somatic dysfunction of lumbar region: Secondary | ICD-10-CM | POA: Diagnosis not present

## 2022-11-15 DIAGNOSIS — M9905 Segmental and somatic dysfunction of pelvic region: Secondary | ICD-10-CM | POA: Diagnosis not present

## 2022-11-22 DIAGNOSIS — M9903 Segmental and somatic dysfunction of lumbar region: Secondary | ICD-10-CM | POA: Diagnosis not present

## 2022-11-22 DIAGNOSIS — M5431 Sciatica, right side: Secondary | ICD-10-CM | POA: Diagnosis not present

## 2022-11-22 DIAGNOSIS — M9905 Segmental and somatic dysfunction of pelvic region: Secondary | ICD-10-CM | POA: Diagnosis not present

## 2022-11-22 DIAGNOSIS — M5417 Radiculopathy, lumbosacral region: Secondary | ICD-10-CM | POA: Diagnosis not present

## 2022-12-02 DIAGNOSIS — M5416 Radiculopathy, lumbar region: Secondary | ICD-10-CM | POA: Diagnosis not present

## 2022-12-02 DIAGNOSIS — M48062 Spinal stenosis, lumbar region with neurogenic claudication: Secondary | ICD-10-CM | POA: Diagnosis not present

## 2022-12-28 DIAGNOSIS — M9905 Segmental and somatic dysfunction of pelvic region: Secondary | ICD-10-CM | POA: Diagnosis not present

## 2022-12-28 DIAGNOSIS — M9903 Segmental and somatic dysfunction of lumbar region: Secondary | ICD-10-CM | POA: Diagnosis not present

## 2022-12-28 DIAGNOSIS — M5417 Radiculopathy, lumbosacral region: Secondary | ICD-10-CM | POA: Diagnosis not present

## 2022-12-28 DIAGNOSIS — M5431 Sciatica, right side: Secondary | ICD-10-CM | POA: Diagnosis not present

## 2022-12-31 DIAGNOSIS — M5416 Radiculopathy, lumbar region: Secondary | ICD-10-CM | POA: Diagnosis not present

## 2022-12-31 DIAGNOSIS — M48062 Spinal stenosis, lumbar region with neurogenic claudication: Secondary | ICD-10-CM | POA: Diagnosis not present

## 2023-01-05 DIAGNOSIS — M545 Low back pain, unspecified: Secondary | ICD-10-CM | POA: Diagnosis not present

## 2023-01-10 DIAGNOSIS — M545 Low back pain, unspecified: Secondary | ICD-10-CM | POA: Diagnosis not present

## 2023-01-14 ENCOUNTER — Other Ambulatory Visit: Payer: Self-pay | Admitting: Internal Medicine

## 2023-01-14 DIAGNOSIS — Z1231 Encounter for screening mammogram for malignant neoplasm of breast: Secondary | ICD-10-CM

## 2023-01-18 DIAGNOSIS — R102 Pelvic and perineal pain: Secondary | ICD-10-CM | POA: Diagnosis not present

## 2023-01-18 DIAGNOSIS — M25559 Pain in unspecified hip: Secondary | ICD-10-CM | POA: Diagnosis not present

## 2023-01-18 DIAGNOSIS — M25569 Pain in unspecified knee: Secondary | ICD-10-CM | POA: Diagnosis not present

## 2023-01-18 DIAGNOSIS — M545 Low back pain, unspecified: Secondary | ICD-10-CM | POA: Diagnosis not present

## 2023-01-20 DIAGNOSIS — M25559 Pain in unspecified hip: Secondary | ICD-10-CM | POA: Diagnosis not present

## 2023-01-20 DIAGNOSIS — M25569 Pain in unspecified knee: Secondary | ICD-10-CM | POA: Diagnosis not present

## 2023-01-20 DIAGNOSIS — M545 Low back pain, unspecified: Secondary | ICD-10-CM | POA: Diagnosis not present

## 2023-01-20 DIAGNOSIS — R102 Pelvic and perineal pain: Secondary | ICD-10-CM | POA: Diagnosis not present

## 2023-01-24 DIAGNOSIS — R102 Pelvic and perineal pain: Secondary | ICD-10-CM | POA: Diagnosis not present

## 2023-01-24 DIAGNOSIS — M25559 Pain in unspecified hip: Secondary | ICD-10-CM | POA: Diagnosis not present

## 2023-01-24 DIAGNOSIS — M25569 Pain in unspecified knee: Secondary | ICD-10-CM | POA: Diagnosis not present

## 2023-01-24 DIAGNOSIS — M545 Low back pain, unspecified: Secondary | ICD-10-CM | POA: Diagnosis not present

## 2023-01-25 DIAGNOSIS — M9903 Segmental and somatic dysfunction of lumbar region: Secondary | ICD-10-CM | POA: Diagnosis not present

## 2023-01-25 DIAGNOSIS — M5431 Sciatica, right side: Secondary | ICD-10-CM | POA: Diagnosis not present

## 2023-01-25 DIAGNOSIS — M9905 Segmental and somatic dysfunction of pelvic region: Secondary | ICD-10-CM | POA: Diagnosis not present

## 2023-01-25 DIAGNOSIS — M5417 Radiculopathy, lumbosacral region: Secondary | ICD-10-CM | POA: Diagnosis not present

## 2023-01-28 DIAGNOSIS — M25559 Pain in unspecified hip: Secondary | ICD-10-CM | POA: Diagnosis not present

## 2023-01-28 DIAGNOSIS — M545 Low back pain, unspecified: Secondary | ICD-10-CM | POA: Diagnosis not present

## 2023-01-28 DIAGNOSIS — R102 Pelvic and perineal pain: Secondary | ICD-10-CM | POA: Diagnosis not present

## 2023-01-28 DIAGNOSIS — M25569 Pain in unspecified knee: Secondary | ICD-10-CM | POA: Diagnosis not present

## 2023-01-31 DIAGNOSIS — M25569 Pain in unspecified knee: Secondary | ICD-10-CM | POA: Diagnosis not present

## 2023-01-31 DIAGNOSIS — M545 Low back pain, unspecified: Secondary | ICD-10-CM | POA: Diagnosis not present

## 2023-01-31 DIAGNOSIS — M25559 Pain in unspecified hip: Secondary | ICD-10-CM | POA: Diagnosis not present

## 2023-01-31 DIAGNOSIS — R102 Pelvic and perineal pain: Secondary | ICD-10-CM | POA: Diagnosis not present

## 2023-02-03 DIAGNOSIS — R202 Paresthesia of skin: Secondary | ICD-10-CM | POA: Diagnosis not present

## 2023-02-03 DIAGNOSIS — E559 Vitamin D deficiency, unspecified: Secondary | ICD-10-CM | POA: Diagnosis not present

## 2023-02-03 DIAGNOSIS — N1831 Chronic kidney disease, stage 3a: Secondary | ICD-10-CM | POA: Diagnosis not present

## 2023-02-03 DIAGNOSIS — I10 Essential (primary) hypertension: Secondary | ICD-10-CM | POA: Diagnosis not present

## 2023-02-03 DIAGNOSIS — E785 Hyperlipidemia, unspecified: Secondary | ICD-10-CM | POA: Diagnosis not present

## 2023-02-05 DIAGNOSIS — M545 Low back pain, unspecified: Secondary | ICD-10-CM | POA: Diagnosis not present

## 2023-02-05 DIAGNOSIS — M25559 Pain in unspecified hip: Secondary | ICD-10-CM | POA: Diagnosis not present

## 2023-02-05 DIAGNOSIS — R102 Pelvic and perineal pain: Secondary | ICD-10-CM | POA: Diagnosis not present

## 2023-02-05 DIAGNOSIS — M25569 Pain in unspecified knee: Secondary | ICD-10-CM | POA: Diagnosis not present

## 2023-02-07 DIAGNOSIS — R102 Pelvic and perineal pain: Secondary | ICD-10-CM | POA: Diagnosis not present

## 2023-02-07 DIAGNOSIS — M545 Low back pain, unspecified: Secondary | ICD-10-CM | POA: Diagnosis not present

## 2023-02-07 DIAGNOSIS — M25559 Pain in unspecified hip: Secondary | ICD-10-CM | POA: Diagnosis not present

## 2023-02-07 DIAGNOSIS — M25569 Pain in unspecified knee: Secondary | ICD-10-CM | POA: Diagnosis not present

## 2023-02-09 DIAGNOSIS — M25559 Pain in unspecified hip: Secondary | ICD-10-CM | POA: Diagnosis not present

## 2023-02-09 DIAGNOSIS — M25569 Pain in unspecified knee: Secondary | ICD-10-CM | POA: Diagnosis not present

## 2023-02-09 DIAGNOSIS — R102 Pelvic and perineal pain: Secondary | ICD-10-CM | POA: Diagnosis not present

## 2023-02-09 DIAGNOSIS — M545 Low back pain, unspecified: Secondary | ICD-10-CM | POA: Diagnosis not present

## 2023-02-10 DIAGNOSIS — I700 Atherosclerosis of aorta: Secondary | ICD-10-CM | POA: Diagnosis not present

## 2023-02-10 DIAGNOSIS — E785 Hyperlipidemia, unspecified: Secondary | ICD-10-CM | POA: Diagnosis not present

## 2023-02-10 DIAGNOSIS — I2510 Atherosclerotic heart disease of native coronary artery without angina pectoris: Secondary | ICD-10-CM | POA: Diagnosis not present

## 2023-02-10 DIAGNOSIS — Z85118 Personal history of other malignant neoplasm of bronchus and lung: Secondary | ICD-10-CM | POA: Diagnosis not present

## 2023-02-10 DIAGNOSIS — Z131 Encounter for screening for diabetes mellitus: Secondary | ICD-10-CM | POA: Diagnosis not present

## 2023-02-10 DIAGNOSIS — Z0000 Encounter for general adult medical examination without abnormal findings: Secondary | ICD-10-CM | POA: Diagnosis not present

## 2023-02-10 DIAGNOSIS — N1831 Chronic kidney disease, stage 3a: Secondary | ICD-10-CM | POA: Diagnosis not present

## 2023-02-10 DIAGNOSIS — N390 Urinary tract infection, site not specified: Secondary | ICD-10-CM | POA: Diagnosis not present

## 2023-02-10 DIAGNOSIS — I10 Essential (primary) hypertension: Secondary | ICD-10-CM | POA: Diagnosis not present

## 2023-02-10 DIAGNOSIS — R600 Localized edema: Secondary | ICD-10-CM | POA: Diagnosis not present

## 2023-02-11 DIAGNOSIS — R600 Localized edema: Secondary | ICD-10-CM | POA: Diagnosis not present

## 2023-02-11 DIAGNOSIS — M5416 Radiculopathy, lumbar region: Secondary | ICD-10-CM | POA: Diagnosis not present

## 2023-02-11 DIAGNOSIS — M48062 Spinal stenosis, lumbar region with neurogenic claudication: Secondary | ICD-10-CM | POA: Diagnosis not present

## 2023-02-14 DIAGNOSIS — M25559 Pain in unspecified hip: Secondary | ICD-10-CM | POA: Diagnosis not present

## 2023-02-14 DIAGNOSIS — M25569 Pain in unspecified knee: Secondary | ICD-10-CM | POA: Diagnosis not present

## 2023-02-14 DIAGNOSIS — M545 Low back pain, unspecified: Secondary | ICD-10-CM | POA: Diagnosis not present

## 2023-02-14 DIAGNOSIS — R102 Pelvic and perineal pain: Secondary | ICD-10-CM | POA: Diagnosis not present

## 2023-02-17 DIAGNOSIS — M8589 Other specified disorders of bone density and structure, multiple sites: Secondary | ICD-10-CM | POA: Diagnosis not present

## 2023-02-22 DIAGNOSIS — M5431 Sciatica, right side: Secondary | ICD-10-CM | POA: Diagnosis not present

## 2023-02-22 DIAGNOSIS — M545 Low back pain, unspecified: Secondary | ICD-10-CM | POA: Diagnosis not present

## 2023-02-22 DIAGNOSIS — M9903 Segmental and somatic dysfunction of lumbar region: Secondary | ICD-10-CM | POA: Diagnosis not present

## 2023-02-22 DIAGNOSIS — R102 Pelvic and perineal pain: Secondary | ICD-10-CM | POA: Diagnosis not present

## 2023-02-22 DIAGNOSIS — M9905 Segmental and somatic dysfunction of pelvic region: Secondary | ICD-10-CM | POA: Diagnosis not present

## 2023-02-22 DIAGNOSIS — M25569 Pain in unspecified knee: Secondary | ICD-10-CM | POA: Diagnosis not present

## 2023-02-22 DIAGNOSIS — M5417 Radiculopathy, lumbosacral region: Secondary | ICD-10-CM | POA: Diagnosis not present

## 2023-02-22 DIAGNOSIS — M25559 Pain in unspecified hip: Secondary | ICD-10-CM | POA: Diagnosis not present

## 2023-03-02 ENCOUNTER — Ambulatory Visit
Admission: RE | Admit: 2023-03-02 | Discharge: 2023-03-02 | Disposition: A | Discharge status: Home / Self Care | Payer: PPO | Source: Ambulatory Visit | Attending: Internal Medicine | Admitting: Internal Medicine

## 2023-03-02 DIAGNOSIS — Z1231 Encounter for screening mammogram for malignant neoplasm of breast: Secondary | ICD-10-CM | POA: Diagnosis not present

## 2023-03-02 DIAGNOSIS — R102 Pelvic and perineal pain: Secondary | ICD-10-CM | POA: Diagnosis not present

## 2023-03-02 DIAGNOSIS — M25569 Pain in unspecified knee: Secondary | ICD-10-CM | POA: Diagnosis not present

## 2023-03-02 DIAGNOSIS — M545 Low back pain, unspecified: Secondary | ICD-10-CM | POA: Diagnosis not present

## 2023-03-02 DIAGNOSIS — M25559 Pain in unspecified hip: Secondary | ICD-10-CM | POA: Diagnosis not present

## 2023-03-07 DIAGNOSIS — M25559 Pain in unspecified hip: Secondary | ICD-10-CM | POA: Diagnosis not present

## 2023-03-07 DIAGNOSIS — M25569 Pain in unspecified knee: Secondary | ICD-10-CM | POA: Diagnosis not present

## 2023-03-07 DIAGNOSIS — R102 Pelvic and perineal pain: Secondary | ICD-10-CM | POA: Diagnosis not present

## 2023-03-07 DIAGNOSIS — M545 Low back pain, unspecified: Secondary | ICD-10-CM | POA: Diagnosis not present

## 2023-03-16 DIAGNOSIS — M545 Low back pain, unspecified: Secondary | ICD-10-CM | POA: Diagnosis not present

## 2023-03-16 DIAGNOSIS — M25569 Pain in unspecified knee: Secondary | ICD-10-CM | POA: Diagnosis not present

## 2023-03-16 DIAGNOSIS — R102 Pelvic and perineal pain: Secondary | ICD-10-CM | POA: Diagnosis not present

## 2023-03-16 DIAGNOSIS — M25559 Pain in unspecified hip: Secondary | ICD-10-CM | POA: Diagnosis not present

## 2023-03-22 DIAGNOSIS — M5417 Radiculopathy, lumbosacral region: Secondary | ICD-10-CM | POA: Diagnosis not present

## 2023-03-22 DIAGNOSIS — M5431 Sciatica, right side: Secondary | ICD-10-CM | POA: Diagnosis not present

## 2023-03-22 DIAGNOSIS — M9903 Segmental and somatic dysfunction of lumbar region: Secondary | ICD-10-CM | POA: Diagnosis not present

## 2023-03-22 DIAGNOSIS — M9905 Segmental and somatic dysfunction of pelvic region: Secondary | ICD-10-CM | POA: Diagnosis not present

## 2023-03-23 DIAGNOSIS — M25559 Pain in unspecified hip: Secondary | ICD-10-CM | POA: Diagnosis not present

## 2023-03-23 DIAGNOSIS — M25569 Pain in unspecified knee: Secondary | ICD-10-CM | POA: Diagnosis not present

## 2023-03-23 DIAGNOSIS — M545 Low back pain, unspecified: Secondary | ICD-10-CM | POA: Diagnosis not present

## 2023-03-23 DIAGNOSIS — R102 Pelvic and perineal pain: Secondary | ICD-10-CM | POA: Diagnosis not present

## 2023-04-20 DIAGNOSIS — M5431 Sciatica, right side: Secondary | ICD-10-CM | POA: Diagnosis not present

## 2023-04-20 DIAGNOSIS — M9903 Segmental and somatic dysfunction of lumbar region: Secondary | ICD-10-CM | POA: Diagnosis not present

## 2023-04-20 DIAGNOSIS — M5417 Radiculopathy, lumbosacral region: Secondary | ICD-10-CM | POA: Diagnosis not present

## 2023-04-20 DIAGNOSIS — M9905 Segmental and somatic dysfunction of pelvic region: Secondary | ICD-10-CM | POA: Diagnosis not present

## 2023-05-16 DIAGNOSIS — M9903 Segmental and somatic dysfunction of lumbar region: Secondary | ICD-10-CM | POA: Diagnosis not present

## 2023-05-16 DIAGNOSIS — M5417 Radiculopathy, lumbosacral region: Secondary | ICD-10-CM | POA: Diagnosis not present

## 2023-05-16 DIAGNOSIS — M5431 Sciatica, right side: Secondary | ICD-10-CM | POA: Diagnosis not present

## 2023-05-16 DIAGNOSIS — M9905 Segmental and somatic dysfunction of pelvic region: Secondary | ICD-10-CM | POA: Diagnosis not present

## 2023-06-01 ENCOUNTER — Ambulatory Visit: Admission: RE | Admit: 2023-06-01 | Payer: PPO | Source: Ambulatory Visit

## 2023-06-06 ENCOUNTER — Ambulatory Visit
Admission: RE | Admit: 2023-06-06 | Discharge: 2023-06-06 | Disposition: A | Discharge status: Home / Self Care | Payer: PPO | Source: Ambulatory Visit | Attending: Radiation Oncology | Admitting: Radiation Oncology

## 2023-06-06 DIAGNOSIS — R911 Solitary pulmonary nodule: Secondary | ICD-10-CM | POA: Diagnosis not present

## 2023-06-06 DIAGNOSIS — R918 Other nonspecific abnormal finding of lung field: Secondary | ICD-10-CM | POA: Diagnosis not present

## 2023-06-06 DIAGNOSIS — Z85118 Personal history of other malignant neoplasm of bronchus and lung: Secondary | ICD-10-CM | POA: Diagnosis not present

## 2023-06-06 DIAGNOSIS — I77810 Thoracic aortic ectasia: Secondary | ICD-10-CM | POA: Diagnosis not present

## 2023-06-06 LAB — POCT I-STAT CREATININE: Creatinine, Ser: 1.2 mg/dL — ABNORMAL HIGH (ref 0.44–1.00)

## 2023-06-06 MED ORDER — IOHEXOL 300 MG/ML  SOLN
60.0000 mL | Freq: Once | INTRAMUSCULAR | Status: AC | PRN
  Administered 2023-06-06: 60 mL via INTRAVENOUS

## 2023-06-08 ENCOUNTER — Ambulatory Visit: Payer: PPO | Admitting: Radiation Oncology

## 2023-06-13 DIAGNOSIS — M5431 Sciatica, right side: Secondary | ICD-10-CM | POA: Diagnosis not present

## 2023-06-13 DIAGNOSIS — M9905 Segmental and somatic dysfunction of pelvic region: Secondary | ICD-10-CM | POA: Diagnosis not present

## 2023-06-13 DIAGNOSIS — M9903 Segmental and somatic dysfunction of lumbar region: Secondary | ICD-10-CM | POA: Diagnosis not present

## 2023-06-13 DIAGNOSIS — M5417 Radiculopathy, lumbosacral region: Secondary | ICD-10-CM | POA: Diagnosis not present

## 2023-06-15 ENCOUNTER — Telehealth: Payer: Self-pay | Admitting: *Deleted

## 2023-06-15 DIAGNOSIS — M8588 Other specified disorders of bone density and structure, other site: Secondary | ICD-10-CM | POA: Diagnosis not present

## 2023-06-15 NOTE — Telephone Encounter (Signed)
 Patient called and wanted to know why the scan was not done, and should she change appt. Or not. I called radiation and they called this am to have radiology to review and results by tomorrow. Then I called the pt back and she will keep the appt tom. And the scan will be read before she gets here.

## 2023-06-16 ENCOUNTER — Ambulatory Visit
Admission: RE | Admit: 2023-06-16 | Discharge: 2023-06-16 | Disposition: A | Discharge status: Home / Self Care | Payer: PPO | Source: Ambulatory Visit | Attending: Radiation Oncology | Admitting: Radiation Oncology

## 2023-06-16 ENCOUNTER — Encounter: Payer: Self-pay | Admitting: Radiation Oncology

## 2023-06-16 VITALS — BP 124/80 | HR 62 | Temp 98.2°F | Resp 16 | Wt 139.7 lb

## 2023-06-16 DIAGNOSIS — R918 Other nonspecific abnormal finding of lung field: Secondary | ICD-10-CM | POA: Diagnosis not present

## 2023-06-16 DIAGNOSIS — R911 Solitary pulmonary nodule: Secondary | ICD-10-CM | POA: Diagnosis not present

## 2023-06-16 NOTE — Progress Notes (Signed)
 Radiation Oncology Follow up Note  Name: Melissa Harvey   Date:   06/16/2023 MRN:  [id number] DOB: [birth year]    This 83 y.o. female presents to the clinic today for 3-year follow-up status post SBRT to right middle lobe for presumed stage I non-small cell lung cancer.  REFERRING PROVIDER: Lynnea Ferrier, MD  HPI: Patient is a 83 year old female now out 3 years having completed SBRT to her right middle lobe for presumed stage I non-small cell lung cancer.  Seen today in routine follow-up she is doing well.  She specifically [place] cough hemoptysis chest tightness or any dysphagia..  She had a CT scan showing stable radiation changes of on the right paramediastinal lung no CT findings suggest recurrent or progressive disease.  No mediastinal or hilar adenopathy.  She does have scattered sub-4 mm pulmonary nodules can consider benign.  She also had a mammogram back in November which I have reviewed was BI-RADS Category 1 negative.  COMPLICATIONS OF TREATMENT: none  FOLLOW UP COMPLIANCE: keeps appointments   PHYSICAL EXAM:  BP 124/80   Pulse 62   Temp 98.2 F (36.8 C) (Tympanic)   Resp 16   Wt 139 lb 11.2 oz (63.4 kg)   BMI 23.25 kg/m  Well-developed well-nourished patient in NAD. HEENT reveals PERLA, EOMI, discs not visualized.  Oral cavity is clear. No oral mucosal lesions are identified. Neck is clear without evidence of cervical or supraclavicular adenopathy. Lungs are clear to A&P. Cardiac examination is essentially unremarkable with regular rate and rhythm without murmur rub or thrill. Abdomen is benign with no organomegaly or masses noted. Motor sensory and DTR levels are equal and symmetric in the upper and lower extremities. Cranial nerves II through XII are grossly intact. Proprioception is intact. No peripheral adenopathy or edema is identified. No motor or sensory levels are noted. Crude visual fields are within normal range.  RADIOLOGY RESULTS: CT scan as well as mammograms  reviewed compatible with above-stated findings  PLAN: Present time patient continues to do well now out over 3 years with no evidence of disease.  She is followed by Hilbert Corrigan her PMD have asked her to have a yearly chest x-ray.  Do not feel I need to continue annual screening with CT scans at this point.  Patient knows to call with any concerns at any time.  I would like to take this opportunity to thank you for allowing me to participate in the care of your patient.Carmina Miller, MD

## 2023-06-22 DIAGNOSIS — M8589 Other specified disorders of bone density and structure, multiple sites: Secondary | ICD-10-CM | POA: Diagnosis not present

## 2023-07-18 DIAGNOSIS — M5417 Radiculopathy, lumbosacral region: Secondary | ICD-10-CM | POA: Diagnosis not present

## 2023-07-18 DIAGNOSIS — M5431 Sciatica, right side: Secondary | ICD-10-CM | POA: Diagnosis not present

## 2023-07-18 DIAGNOSIS — M9905 Segmental and somatic dysfunction of pelvic region: Secondary | ICD-10-CM | POA: Diagnosis not present

## 2023-07-18 DIAGNOSIS — M9903 Segmental and somatic dysfunction of lumbar region: Secondary | ICD-10-CM | POA: Diagnosis not present

## 2023-07-21 ENCOUNTER — Other Ambulatory Visit: Payer: Self-pay | Admitting: Cardiovascular Disease

## 2023-08-04 DIAGNOSIS — Z131 Encounter for screening for diabetes mellitus: Secondary | ICD-10-CM | POA: Diagnosis not present

## 2023-08-04 DIAGNOSIS — N1831 Chronic kidney disease, stage 3a: Secondary | ICD-10-CM | POA: Diagnosis not present

## 2023-08-04 DIAGNOSIS — E785 Hyperlipidemia, unspecified: Secondary | ICD-10-CM | POA: Diagnosis not present

## 2023-08-04 DIAGNOSIS — I10 Essential (primary) hypertension: Secondary | ICD-10-CM | POA: Diagnosis not present

## 2023-08-14 ENCOUNTER — Other Ambulatory Visit: Payer: Self-pay | Admitting: Cardiovascular Disease

## 2023-08-15 DIAGNOSIS — M5431 Sciatica, right side: Secondary | ICD-10-CM | POA: Diagnosis not present

## 2023-08-15 DIAGNOSIS — M9903 Segmental and somatic dysfunction of lumbar region: Secondary | ICD-10-CM | POA: Diagnosis not present

## 2023-08-15 DIAGNOSIS — M5417 Radiculopathy, lumbosacral region: Secondary | ICD-10-CM | POA: Diagnosis not present

## 2023-08-15 DIAGNOSIS — M9905 Segmental and somatic dysfunction of pelvic region: Secondary | ICD-10-CM | POA: Diagnosis not present

## 2023-08-17 DIAGNOSIS — D2271 Melanocytic nevi of right lower limb, including hip: Secondary | ICD-10-CM | POA: Diagnosis not present

## 2023-08-17 DIAGNOSIS — D692 Other nonthrombocytopenic purpura: Secondary | ICD-10-CM | POA: Diagnosis not present

## 2023-08-17 DIAGNOSIS — D2262 Melanocytic nevi of left upper limb, including shoulder: Secondary | ICD-10-CM | POA: Diagnosis not present

## 2023-08-17 DIAGNOSIS — L570 Actinic keratosis: Secondary | ICD-10-CM | POA: Diagnosis not present

## 2023-08-17 DIAGNOSIS — D2272 Melanocytic nevi of left lower limb, including hip: Secondary | ICD-10-CM | POA: Diagnosis not present

## 2023-08-17 DIAGNOSIS — D2261 Melanocytic nevi of right upper limb, including shoulder: Secondary | ICD-10-CM | POA: Diagnosis not present

## 2023-08-17 DIAGNOSIS — L720 Epidermal cyst: Secondary | ICD-10-CM | POA: Diagnosis not present

## 2023-08-18 DIAGNOSIS — I2510 Atherosclerotic heart disease of native coronary artery without angina pectoris: Secondary | ICD-10-CM | POA: Diagnosis not present

## 2023-08-18 DIAGNOSIS — N390 Urinary tract infection, site not specified: Secondary | ICD-10-CM | POA: Diagnosis not present

## 2023-08-18 DIAGNOSIS — R7303 Prediabetes: Secondary | ICD-10-CM | POA: Diagnosis not present

## 2023-08-18 DIAGNOSIS — I700 Atherosclerosis of aorta: Secondary | ICD-10-CM | POA: Diagnosis not present

## 2023-08-18 DIAGNOSIS — E785 Hyperlipidemia, unspecified: Secondary | ICD-10-CM | POA: Diagnosis not present

## 2023-08-18 DIAGNOSIS — Z85118 Personal history of other malignant neoplasm of bronchus and lung: Secondary | ICD-10-CM | POA: Diagnosis not present

## 2023-08-18 DIAGNOSIS — D692 Other nonthrombocytopenic purpura: Secondary | ICD-10-CM | POA: Diagnosis not present

## 2023-08-18 DIAGNOSIS — N1831 Chronic kidney disease, stage 3a: Secondary | ICD-10-CM | POA: Diagnosis not present

## 2023-08-18 DIAGNOSIS — I10 Essential (primary) hypertension: Secondary | ICD-10-CM | POA: Diagnosis not present

## 2023-09-08 ENCOUNTER — Other Ambulatory Visit: Payer: Self-pay | Admitting: Cardiovascular Disease

## 2023-09-09 ENCOUNTER — Telehealth: Payer: Self-pay | Admitting: Cardiovascular Disease

## 2023-09-09 MED ORDER — EZETIMIBE 10 MG PO TABS: 10.0000 mg | ORAL_TABLET | Freq: Every day | ORAL | 1 refills | Status: DC

## 2023-09-09 NOTE — Telephone Encounter (Signed)
 Pt's medication was sent to pt's pharmacy as requested. Confirmation received.

## 2023-09-09 NOTE — Telephone Encounter (Signed)
*  STAT* If patient is at the pharmacy, call can be transferred to refill team.   1. Which medications need to be refilled? (please list name of each medication and dose if known)   ezetimibe  (ZETIA ) 10 MG tablet  2. Which pharmacy/location (including street and city if local pharmacy) is medication to be sent to? [id number] - [place], [place] - [address]   3. Do they need a 30 day or 90 day supply? 90  Appt on 7/25

## 2023-09-12 DIAGNOSIS — M9903 Segmental and somatic dysfunction of lumbar region: Secondary | ICD-10-CM | POA: Diagnosis not present

## 2023-09-12 DIAGNOSIS — M5417 Radiculopathy, lumbosacral region: Secondary | ICD-10-CM | POA: Diagnosis not present

## 2023-09-12 DIAGNOSIS — M5431 Sciatica, right side: Secondary | ICD-10-CM | POA: Diagnosis not present

## 2023-09-12 DIAGNOSIS — M9905 Segmental and somatic dysfunction of pelvic region: Secondary | ICD-10-CM | POA: Diagnosis not present

## 2023-10-17 DIAGNOSIS — M9903 Segmental and somatic dysfunction of lumbar region: Secondary | ICD-10-CM | POA: Diagnosis not present

## 2023-10-17 DIAGNOSIS — M9905 Segmental and somatic dysfunction of pelvic region: Secondary | ICD-10-CM | POA: Diagnosis not present

## 2023-10-17 DIAGNOSIS — M5417 Radiculopathy, lumbosacral region: Secondary | ICD-10-CM | POA: Diagnosis not present

## 2023-10-17 DIAGNOSIS — M5431 Sciatica, right side: Secondary | ICD-10-CM | POA: Diagnosis not present

## 2023-11-01 ENCOUNTER — Ambulatory Visit: Admitting: Cardiovascular Disease

## 2023-11-03 NOTE — Progress Notes (Unsigned)
 Cardiology Office Note  Date:  11/04/2023   ID:  Melissa Harvey, DOB [birth year], MRN [id number]  PCP:  Fernande Ophelia JINNY DOUGLAS, MD   Chief Complaint  Patient presents with   12 month follow up     Doing well.  Patient c/o bruising a lot even though she has recently change the dose of aspirin to every other day.     HPI:  Melissa Harvey is a 83 year old woman with past medical history of Mild coronary artery calcification non-small cell lung cancer (s/p radiation and sees completed November 2021),  hypertension,  hyperlipidemia,  CKD 3  Who presents for follow-up of her coronary disease  Last seen by myself in clinic 4/24 Active, walks dog Bru[place] , takes asa every other day  Denies significant shortness of breath on exertion, no chest pain  CT scan for lung cancer surveillance February 2024   very mild aortic atherosclerosis as well as very mild right coronary artery calcium.   Lab work reviewed Total cholesterol 174 LDL 100 A1C 5.9 Side effects on crestor, stopped  for myalgias  Stress test February 2023 No significant ischemia  EKG personally reviewed by myself on todays visit EKG Interpretation Date/Time:  Friday November 04 2023 09:31:54 EDT Ventricular Rate:  66 PR Interval:  180 QRS Duration:  70 QT Interval:  416 QTC Calculation: 436 R Axis:   -22  Text Interpretation: Normal sinus rhythm Possible Left atrial enlargement When compared with ECG of 26-Sep-2019 11:57, No significant change was found Confirmed by Perla Lye [phone]) on 11/04/2023 9:56:03 AM    PMH:   has a past medical history of Allergic rhinitis, Cancer (HCC), Chronic kidney disease, Colon polyps, and Hypertension.  PSH:    Past Surgical History:  Procedure Laterality Date   COLONOSCOPY WITH PROPOFOL  N/A 12/07/2018   Procedure: COLONOSCOPY WITH PROPOFOL ;  Surgeon: Gaylyn Gladis PENNER, MD;  Location: [facility] ENDOSCOPY;  Service: Endoscopy;  Laterality: N/A;   PAROTIDECTOMY Right 04/09/2020    Procedure: PAROTIDECTOMY;  Surgeon: Blair Mt, MD;  Location: [facility] ORS;  Service: ENT;  Laterality: Right;   TONSILLECTOMY      Current Outpatient Medications  Medication Sig Dispense Refill   acetaminophen  (TYLENOL ) 500 MG tablet Take 500 mg by mouth every 6 (six) hours as needed for moderate pain.     amLODipine (NORVASC) 2.5 MG tablet Take 2.5 mg by mouth every morning.      aspirin 81 MG chewable tablet Chew 81 mg by mouth every other day.     Calcium Carbonate-Vitamin D (CALCIUM + D PO) Take 1 each by mouth daily.     Cholecalciferol (VITAMIN D) 50 MCG (2000 UT) tablet Take 2,000 Units by mouth daily.     CRANBERRY PO Take 500 mg by mouth daily.     cyanocobalamin (VITAMIN B12) 1000 MCG tablet Take 1,000 mcg by mouth daily.     ezetimibe  (ZETIA ) 10 MG tablet Take 1 tablet (10 mg total) by mouth daily. 30 tablet 1   gabapentin (NEURONTIN) 100 MG capsule Take 100 mg by mouth daily at 6 (six) AM.     magnesium oxide (MAG-OX) 400 (240 Mg) MG tablet Take 400 mg by mouth daily.     Probiotic Product (PROBIOTIC DAILY PO) Take 1 capsule by mouth daily.     No current facility-administered medications for this visit.     Allergies:   Alendronate and Rosuvastatin   Social History:  The patient  reports that she has never smoked. She  has never used smokeless tobacco. She reports that she does not drink alcohol  and does not use drugs.   Family History:   family history includes Breast cancer (age of onset: 74) in her maternal grandmother; Heart attack in her brother.    Review of Systems: Review of Systems  Constitutional: Negative.   HENT: Negative.    Respiratory: Negative.    Cardiovascular: Negative.   Gastrointestinal: Negative.   Musculoskeletal: Negative.   Neurological: Negative.   Psychiatric/Behavioral: Negative.    All other systems reviewed and are negative.   PHYSICAL EXAM: VS:  BP (!) 140/80 (BP Location: Left Arm, Patient Position: Sitting, Cuff Size: Normal)    Pulse 66   Ht 5' 5.5 (1.664 m)   Wt 134 lb 2 oz (60.8 kg)   SpO2 98%   BMI 21.98 kg/m  , BMI Body mass index is 21.98 kg/m. Constitutional:  oriented to person, place, and time. No distress.  HENT:  Head: Grossly normal Eyes:  no discharge. No scleral icterus.  Neck: No JVD, no carotid bruits  Cardiovascular: Regular rate and rhythm, no murmurs appreciated Pulmonary/Chest: Clear to auscultation bilaterally, no wheezes or rails Abdominal: Soft.  no distension.  no tenderness.  Musculoskeletal: Normal range of motion Neurological:  normal muscle tone. Coordination normal. No atrophy Skin: Skin warm and dry Psychiatric: normal affect, pleasant  Recent Labs: 06/06/2023: Creatinine, Ser 1.20    Lipid Panel No results found for: CHOL, HDL, LDLCALC, TRIG    Wt Readings from Last 3 Encounters:  11/04/23 134 lb 2 oz (60.8 kg)  06/16/23 139 lb 11.2 oz (63.4 kg)  07/26/22 139 lb 6 oz (63.2 kg)      ASSESSMENT AND PLAN:  Problem List Items Addressed This Visit       Cardiology Problems   Hyperlipidemia   Relevant Medications   aspirin 81 MG chewable tablet     Other   CKD (chronic kidney disease) stage 3, GFR 30-59 ml/min (HCC)   Other Visit Diagnoses       Coronary artery disease of native artery of native heart with stable angina pectoris (HCC)    -  Primary   Relevant Medications   aspirin 81 MG chewable tablet   Other Relevant Orders   EKG 12-Lead (Completed)     Aortic atherosclerosis (HCC)       Relevant Medications   aspirin 81 MG chewable tablet   Other Relevant Orders   EKG 12-Lead (Completed)     Benign essential HTN       Relevant Medications   aspirin 81 MG chewable tablet   Other Relevant Orders   EKG 12-Lead (Completed)       Aortic atherosclerosis -Very mild disease on CT scan -On Zetia , off Crestor -No strong indication for aspirin  Coronary calcification Minimal calcification noted in the RCA noted on CT images  - On Zetia , no  statin given history of myalgias -No strong indication for aspirin as above especially in light of arm bruising  Essential hypertension Blood pressure is high end of her range, recommend close monitoring at home. No changes made to the medications.  Hyperlipidemia Statin myalgias, off Crestor Continue Zetia  10 mg daily Given minimal disease would not pursue PCSK9 inhibitor   Signed, Velinda Lunger, M.D., Ph.D. [facility] [place], [place] [phone]

## 2023-11-04 ENCOUNTER — Ambulatory Visit: Attending: Cardiovascular Disease | Admitting: Cardiovascular Disease

## 2023-11-04 ENCOUNTER — Encounter: Payer: Self-pay | Admitting: Cardiovascular Disease

## 2023-11-04 VITALS — BP 140/80 | HR 66 | Ht 65.5 in | Wt 134.1 lb

## 2023-11-04 DIAGNOSIS — E782 Mixed hyperlipidemia: Secondary | ICD-10-CM

## 2023-11-04 DIAGNOSIS — I10 Essential (primary) hypertension: Secondary | ICD-10-CM

## 2023-11-04 DIAGNOSIS — I25118 Atherosclerotic heart disease of native coronary artery with other forms of angina pectoris: Secondary | ICD-10-CM | POA: Diagnosis not present

## 2023-11-04 DIAGNOSIS — I700 Atherosclerosis of aorta: Secondary | ICD-10-CM | POA: Diagnosis not present

## 2023-11-04 DIAGNOSIS — N183 Chronic kidney disease, stage 3 unspecified: Secondary | ICD-10-CM | POA: Diagnosis not present

## 2023-11-04 MED ORDER — EZETIMIBE 10 MG PO TABS: 10.0000 mg | ORAL_TABLET | Freq: Every day | ORAL | 3 refills | Status: AC

## 2023-11-04 NOTE — Patient Instructions (Addendum)

## 2023-11-07 ENCOUNTER — Encounter: Payer: Self-pay | Admitting: Cardiovascular Disease

## 2023-11-21 DIAGNOSIS — M9903 Segmental and somatic dysfunction of lumbar region: Secondary | ICD-10-CM | POA: Diagnosis not present

## 2023-11-21 DIAGNOSIS — M9905 Segmental and somatic dysfunction of pelvic region: Secondary | ICD-10-CM | POA: Diagnosis not present

## 2023-11-21 DIAGNOSIS — M5431 Sciatica, right side: Secondary | ICD-10-CM | POA: Diagnosis not present

## 2023-11-21 DIAGNOSIS — M5417 Radiculopathy, lumbosacral region: Secondary | ICD-10-CM | POA: Diagnosis not present

## 2023-11-23 DIAGNOSIS — R399 Unspecified symptoms and signs involving the genitourinary system: Secondary | ICD-10-CM | POA: Diagnosis not present

## 2023-12-19 DIAGNOSIS — M5417 Radiculopathy, lumbosacral region: Secondary | ICD-10-CM | POA: Diagnosis not present

## 2023-12-19 DIAGNOSIS — M9903 Segmental and somatic dysfunction of lumbar region: Secondary | ICD-10-CM | POA: Diagnosis not present

## 2023-12-19 DIAGNOSIS — M5431 Sciatica, right side: Secondary | ICD-10-CM | POA: Diagnosis not present

## 2023-12-19 DIAGNOSIS — M9905 Segmental and somatic dysfunction of pelvic region: Secondary | ICD-10-CM | POA: Diagnosis not present

## 2024-01-16 DIAGNOSIS — M9903 Segmental and somatic dysfunction of lumbar region: Secondary | ICD-10-CM | POA: Diagnosis not present

## 2024-01-16 DIAGNOSIS — M5431 Sciatica, right side: Secondary | ICD-10-CM | POA: Diagnosis not present

## 2024-01-16 DIAGNOSIS — M5417 Radiculopathy, lumbosacral region: Secondary | ICD-10-CM | POA: Diagnosis not present

## 2024-01-16 DIAGNOSIS — M9905 Segmental and somatic dysfunction of pelvic region: Secondary | ICD-10-CM | POA: Diagnosis not present

## 2024-02-03 ENCOUNTER — Other Ambulatory Visit: Payer: Self-pay | Admitting: Internal Medicine

## 2024-02-03 DIAGNOSIS — Z1231 Encounter for screening mammogram for malignant neoplasm of breast: Secondary | ICD-10-CM

## 2024-02-13 DIAGNOSIS — M9905 Segmental and somatic dysfunction of pelvic region: Secondary | ICD-10-CM | POA: Diagnosis not present

## 2024-02-13 DIAGNOSIS — M5431 Sciatica, right side: Secondary | ICD-10-CM | POA: Diagnosis not present

## 2024-02-13 DIAGNOSIS — M9903 Segmental and somatic dysfunction of lumbar region: Secondary | ICD-10-CM | POA: Diagnosis not present

## 2024-02-13 DIAGNOSIS — M5417 Radiculopathy, lumbosacral region: Secondary | ICD-10-CM | POA: Diagnosis not present

## 2024-02-20 DIAGNOSIS — I2510 Atherosclerotic heart disease of native coronary artery without angina pectoris: Secondary | ICD-10-CM | POA: Diagnosis not present

## 2024-02-20 DIAGNOSIS — I700 Atherosclerosis of aorta: Secondary | ICD-10-CM | POA: Diagnosis not present

## 2024-02-20 DIAGNOSIS — N1831 Chronic kidney disease, stage 3a: Secondary | ICD-10-CM | POA: Diagnosis not present

## 2024-02-20 DIAGNOSIS — M8589 Other specified disorders of bone density and structure, multiple sites: Secondary | ICD-10-CM | POA: Diagnosis not present

## 2024-02-20 DIAGNOSIS — E785 Hyperlipidemia, unspecified: Secondary | ICD-10-CM | POA: Diagnosis not present

## 2024-02-20 DIAGNOSIS — R7303 Prediabetes: Secondary | ICD-10-CM | POA: Diagnosis not present

## 2024-02-27 DIAGNOSIS — D692 Other nonthrombocytopenic purpura: Secondary | ICD-10-CM | POA: Diagnosis not present

## 2024-02-27 DIAGNOSIS — E785 Hyperlipidemia, unspecified: Secondary | ICD-10-CM | POA: Diagnosis not present

## 2024-02-27 DIAGNOSIS — R7303 Prediabetes: Secondary | ICD-10-CM | POA: Diagnosis not present

## 2024-02-27 DIAGNOSIS — N390 Urinary tract infection, site not specified: Secondary | ICD-10-CM | POA: Diagnosis not present

## 2024-02-27 DIAGNOSIS — Z0000 Encounter for general adult medical examination without abnormal findings: Secondary | ICD-10-CM | POA: Diagnosis not present

## 2024-02-27 DIAGNOSIS — Z66 Do not resuscitate: Secondary | ICD-10-CM | POA: Diagnosis not present

## 2024-02-27 DIAGNOSIS — N1831 Chronic kidney disease, stage 3a: Secondary | ICD-10-CM | POA: Diagnosis not present

## 2024-02-27 DIAGNOSIS — Z1331 Encounter for screening for depression: Secondary | ICD-10-CM | POA: Diagnosis not present

## 2024-02-27 DIAGNOSIS — I2510 Atherosclerotic heart disease of native coronary artery without angina pectoris: Secondary | ICD-10-CM | POA: Diagnosis not present

## 2024-02-27 DIAGNOSIS — I10 Essential (primary) hypertension: Secondary | ICD-10-CM | POA: Diagnosis not present

## 2024-02-27 DIAGNOSIS — Z85118 Personal history of other malignant neoplasm of bronchus and lung: Secondary | ICD-10-CM | POA: Diagnosis not present

## 2024-02-27 DIAGNOSIS — I700 Atherosclerosis of aorta: Secondary | ICD-10-CM | POA: Diagnosis not present

## 2024-03-02 DIAGNOSIS — L309 Dermatitis, unspecified: Secondary | ICD-10-CM | POA: Diagnosis not present

## 2024-03-02 DIAGNOSIS — L820 Inflamed seborrheic keratosis: Secondary | ICD-10-CM | POA: Diagnosis not present

## 2024-03-02 DIAGNOSIS — B353 Tinea pedis: Secondary | ICD-10-CM | POA: Diagnosis not present

## 2024-03-02 DIAGNOSIS — L570 Actinic keratosis: Secondary | ICD-10-CM | POA: Diagnosis not present

## 2024-03-02 DIAGNOSIS — L851 Acquired keratosis [keratoderma] palmaris et plantaris: Secondary | ICD-10-CM | POA: Diagnosis not present

## 2024-03-02 DIAGNOSIS — R208 Other disturbances of skin sensation: Secondary | ICD-10-CM | POA: Diagnosis not present

## 2024-03-12 ENCOUNTER — Ambulatory Visit
Admission: RE | Admit: 2024-03-12 | Discharge: 2024-03-12 | Disposition: A | Source: Ambulatory Visit | Attending: Internal Medicine | Admitting: Internal Medicine

## 2024-03-12 DIAGNOSIS — Z1231 Encounter for screening mammogram for malignant neoplasm of breast: Secondary | ICD-10-CM | POA: Insufficient documentation

## 2024-03-14 DIAGNOSIS — M9905 Segmental and somatic dysfunction of pelvic region: Secondary | ICD-10-CM | POA: Diagnosis not present

## 2024-03-14 DIAGNOSIS — M9903 Segmental and somatic dysfunction of lumbar region: Secondary | ICD-10-CM | POA: Diagnosis not present

## 2024-03-14 DIAGNOSIS — M5431 Sciatica, right side: Secondary | ICD-10-CM | POA: Diagnosis not present

## 2024-03-14 DIAGNOSIS — M5417 Radiculopathy, lumbosacral region: Secondary | ICD-10-CM | POA: Diagnosis not present

## 2024-04-17 NOTE — Progress Notes (Signed)
 Melissa Harvey                                          MRN: [id number]   04/17/2024   The [facility] Quality Team Specialist reviewed this patient medical record for the purposes of chart review for care gap closure. The following were reviewed: chart review for care gap closure-controlling blood pressure.    [facility] Quality Team
